# Patient Record
Sex: Male | Born: 1975 | Race: Black or African American | Hispanic: No | Smoking: Current every day smoker
Health system: Southern US, Community
[De-identification: ages and names within clinical notes are randomized; demographics above are authoritative.]

## PROBLEM LIST (undated history)

## (undated) DIAGNOSIS — R569 Unspecified convulsions: Secondary | ICD-10-CM

## (undated) DIAGNOSIS — Z91148 Patient's other noncompliance with medication regimen for other reason: Secondary | ICD-10-CM

## (undated) DIAGNOSIS — Z9114 Patient's other noncompliance with medication regimen: Secondary | ICD-10-CM

## (undated) HISTORY — PX: FIBULA FRACTURE SURGERY: SHX947

---

## 1998-03-15 ENCOUNTER — Emergency Department (HOSPITAL_COMMUNITY): Admission: EM | Admit: 1998-03-15 | Discharge: 1998-03-15 | Payer: Self-pay | Admitting: Emergency Medicine

## 2005-10-05 ENCOUNTER — Encounter: Payer: Self-pay | Admitting: Emergency Medicine

## 2009-06-25 ENCOUNTER — Emergency Department (HOSPITAL_COMMUNITY): Admission: EM | Admit: 2009-06-25 | Discharge: 2009-06-25 | Payer: Self-pay | Admitting: Emergency Medicine

## 2009-08-03 ENCOUNTER — Emergency Department (HOSPITAL_COMMUNITY): Admission: EM | Admit: 2009-08-03 | Discharge: 2009-08-03 | Payer: Self-pay | Admitting: Emergency Medicine

## 2010-01-23 ENCOUNTER — Emergency Department (HOSPITAL_COMMUNITY): Admission: EM | Admit: 2010-01-23 | Discharge: 2010-01-23 | Payer: Self-pay | Admitting: Emergency Medicine

## 2010-04-18 ENCOUNTER — Emergency Department (HOSPITAL_COMMUNITY)
Admission: EM | Admit: 2010-04-18 | Discharge: 2010-04-18 | Payer: Self-pay | Source: Home / Self Care | Admitting: Emergency Medicine

## 2010-06-24 ENCOUNTER — Emergency Department (HOSPITAL_COMMUNITY)
Admission: EM | Admit: 2010-06-24 | Discharge: 2010-06-24 | Payer: Self-pay | Source: Home / Self Care | Admitting: Emergency Medicine

## 2010-06-28 LAB — POCT I-STAT, CHEM 8
BUN: 9 mg/dL (ref 6–23)
Calcium, Ion: 1.02 mmol/L — ABNORMAL LOW (ref 1.12–1.32)
Chloride: 108 mEq/L (ref 96–112)
Glucose, Bld: 87 mg/dL (ref 70–99)
Potassium: 3.8 mEq/L (ref 3.5–5.1)

## 2010-08-17 LAB — BASIC METABOLIC PANEL
BUN: 12 mg/dL (ref 6–23)
Chloride: 112 mEq/L (ref 96–112)
GFR calc non Af Amer: >60 mL/min (ref >60)
Glucose, Bld: 75 mg/dL (ref 70–99)
Potassium: 4.8 mEq/L (ref 3.5–5.1)
Sodium: 143 mEq/L (ref 135–145)

## 2010-08-17 LAB — PHENYTOIN LEVEL, TOTAL: Phenytoin Lvl: <10 ug/mL — ABNORMAL LOW (ref 10.0–20.0)

## 2010-08-20 LAB — URINALYSIS, ROUTINE W REFLEX MICROSCOPIC
Bilirubin Urine: NEGATIVE
Nitrite: NEGATIVE
Specific Gravity, Urine: 1.025 (ref 1.005–1.030)
Urobilinogen, UA: 1 mg/dL (ref 0.0–1.0)
pH: 5.5 (ref 5.0–8.0)

## 2010-08-20 LAB — POCT I-STAT, CHEM 8
BUN: 16 mg/dL (ref 6–23)
Calcium, Ion: 1.12 mmol/L (ref 1.12–1.32)
Chloride: 106 mEq/L (ref 96–112)
HCT: 43 % (ref 39.0–52.0)
Potassium: 3.9 mEq/L (ref 3.5–5.1)

## 2010-08-20 LAB — URINE MICROSCOPIC-ADD ON

## 2010-08-20 LAB — RAPID URINE DRUG SCREEN, HOSP PERFORMED
Amphetamines: NOT DETECTED
Opiates: NOT DETECTED
Tetrahydrocannabinol: POSITIVE — AB

## 2010-08-23 LAB — CBC
HCT: 39.3 % (ref 39.0–52.0)
Hemoglobin: 13.4 g/dL (ref 13.0–17.0)
MCHC: 34.1 g/dL (ref 30.0–36.0)
MCV: 91.9 fL (ref 78.0–100.0)
RBC: 4.28 MIL/uL (ref 4.22–5.81)
WBC: 10.4 10*3/uL (ref 4.0–10.5)

## 2010-08-23 LAB — BASIC METABOLIC PANEL
CO2: 23 mEq/L (ref 19–32)
Chloride: 102 mEq/L (ref 96–112)
GFR calc Af Amer: >60 mL/min (ref >60)
Potassium: 3.9 mEq/L (ref 3.5–5.1)
Sodium: 136 mEq/L (ref 135–145)

## 2010-08-23 LAB — DIFFERENTIAL
Basophils Relative: 1 % (ref 0–1)
Eosinophils Absolute: 0.2 10*3/uL (ref 0.0–0.7)
Lymphs Abs: 1.8 10*3/uL (ref 0.7–4.0)
Monocytes Absolute: 0.7 10*3/uL (ref 0.1–1.0)
Monocytes Relative: 7 % (ref 3–12)
Neutrophils Relative %: 74 % (ref 43–77)

## 2010-08-23 LAB — RAPID URINE DRUG SCREEN, HOSP PERFORMED
Amphetamines: NOT DETECTED
Barbiturates: NOT DETECTED
Benzodiazepines: NOT DETECTED
Tetrahydrocannabinol: NOT DETECTED

## 2010-08-26 ENCOUNTER — Emergency Department (HOSPITAL_COMMUNITY)
Admission: EM | Admit: 2010-08-26 | Discharge: 2010-08-27 | Disposition: A | Discharge status: Home / Self Care | Payer: Self-pay | Attending: Emergency Medicine | Admitting: Emergency Medicine

## 2010-08-26 DIAGNOSIS — R569 Unspecified convulsions: Secondary | ICD-10-CM | POA: Insufficient documentation

## 2010-08-26 DIAGNOSIS — F17200 Nicotine dependence, unspecified, uncomplicated: Secondary | ICD-10-CM | POA: Insufficient documentation

## 2010-08-26 LAB — CBC
MCHC: 34.8 g/dL (ref 30.0–36.0)
MCV: 94.4 fL (ref 78.0–100.0)
Platelets: 232 10*3/uL (ref 150–400)
WBC: 11.5 10*3/uL — ABNORMAL HIGH (ref 4.0–10.5)

## 2010-08-26 LAB — BASIC METABOLIC PANEL
BUN: 7 mg/dL (ref 6–23)
CO2: 26 mEq/L (ref 19–32)
Calcium: 8.3 mg/dL — ABNORMAL LOW (ref 8.4–10.5)
Chloride: 104 mEq/L (ref 96–112)
Creatinine, Ser: 0.79 mg/dL (ref 0.4–1.5)

## 2010-08-26 LAB — DIFFERENTIAL
Basophils Relative: 0 % (ref 0–1)
Eosinophils Absolute: 0.2 10*3/uL (ref 0.0–0.7)
Neutro Abs: 7.9 10*3/uL — ABNORMAL HIGH (ref 1.7–7.7)
Neutrophils Relative %: 69 % (ref 43–77)

## 2010-08-26 LAB — GLUCOSE, CAPILLARY: Glucose-Capillary: 86 mg/dL (ref 70–99)

## 2010-10-13 ENCOUNTER — Emergency Department (HOSPITAL_COMMUNITY)
Admission: EM | Admit: 2010-10-13 | Discharge: 2010-10-13 | Disposition: A | Discharge status: Home / Self Care | Payer: Self-pay | Attending: Emergency Medicine | Admitting: Emergency Medicine

## 2010-10-13 DIAGNOSIS — Z79899 Other long term (current) drug therapy: Secondary | ICD-10-CM | POA: Insufficient documentation

## 2010-10-13 DIAGNOSIS — G40802 Other epilepsy, not intractable, without status epilepticus: Secondary | ICD-10-CM | POA: Insufficient documentation

## 2010-10-13 LAB — BASIC METABOLIC PANEL
CO2: 25 mEq/L (ref 19–32)
Calcium: 9.3 mg/dL (ref 8.4–10.5)
Chloride: 104 mEq/L (ref 96–112)
GFR calc Af Amer: >60 mL/min (ref >60)
Potassium: 3.7 mEq/L (ref 3.5–5.1)
Sodium: 138 mEq/L (ref 135–145)

## 2010-10-13 LAB — CBC
Hemoglobin: 13.2 g/dL (ref 13.0–17.0)
RBC: 4.39 MIL/uL (ref 4.22–5.81)
WBC: 9.3 10*3/uL (ref 4.0–10.5)

## 2010-10-13 LAB — DIFFERENTIAL
Basophils Absolute: 0.1 10*3/uL (ref 0.0–0.1)
Basophils Relative: 1 % (ref 0–1)
Neutro Abs: 6.2 10*3/uL (ref 1.7–7.7)
Neutrophils Relative %: 67 % (ref 43–77)

## 2010-12-08 ENCOUNTER — Emergency Department (HOSPITAL_COMMUNITY)
Admission: EM | Admit: 2010-12-08 | Discharge: 2010-12-09 | Disposition: A | Discharge status: Home / Self Care | Payer: Self-pay | Attending: Emergency Medicine | Admitting: Emergency Medicine

## 2010-12-08 DIAGNOSIS — W503XXA Accidental bite by another person, initial encounter: Secondary | ICD-10-CM | POA: Insufficient documentation

## 2010-12-08 DIAGNOSIS — G40909 Epilepsy, unspecified, not intractable, without status epilepticus: Secondary | ICD-10-CM | POA: Insufficient documentation

## 2010-12-08 DIAGNOSIS — S01501A Unspecified open wound of lip, initial encounter: Secondary | ICD-10-CM | POA: Insufficient documentation

## 2010-12-08 DIAGNOSIS — F17200 Nicotine dependence, unspecified, uncomplicated: Secondary | ICD-10-CM | POA: Insufficient documentation

## 2011-02-07 ENCOUNTER — Emergency Department (HOSPITAL_COMMUNITY)
Admission: EM | Admit: 2011-02-07 | Discharge: 2011-02-08 | Disposition: A | Discharge status: Home / Self Care | Payer: Self-pay | Attending: Emergency Medicine | Admitting: Emergency Medicine

## 2011-02-07 DIAGNOSIS — R569 Unspecified convulsions: Secondary | ICD-10-CM | POA: Insufficient documentation

## 2011-02-07 HISTORY — DX: Unspecified convulsions: R56.9

## 2011-02-07 MED ORDER — LORAZEPAM 1 MG PO TABS
1.0000 mg | ORAL_TABLET | Freq: Once | ORAL | Status: AC
  Administered 2011-02-07: 1 mg via ORAL
  Filled 2011-02-07: qty 1

## 2011-02-07 NOTE — ED Notes (Signed)
Pt states does not remember having the seizure, Sibling at home with him at time and called EMS support. Pt is slow to respond to questions and commands.  No seizure activity noted at this time.

## 2011-02-07 NOTE — ED Provider Notes (Signed)
History     CSN: [id number] Arrival date & time: 02/07/2011  8:52 PM  Chief Complaint  Patient presents with  . Seizures   HPI Comments: Patient is a 35 year old male with a history of seizure disorder takes Dilantin for his seizures. Family admits that he has been out of his medicine for approximately 2 months. According to patient and family member he had a seizure this morning that was not witnessed by a family member but by other people. He came out of it without any difficulty and is felt most of the day dizzy and lightheaded. He denies chest pain, shortness of breath, fever chills nausea or vomiting. He still states that he feels mildly dizzy.  Patient is a 35 y.o. male presenting with seizures. The history is provided by the patient, a relative and medical records. History Limited By: Postictal confusion.  Seizures     Past Medical History  Diagnosis Date  . Seizures     Past Surgical History  Procedure Date  . Unable     History reviewed. No pertinent family history.  History  Substance Use Topics  . Smoking status: Smoker, Current Status Unknown  . Smokeless tobacco: Not on file  . Alcohol Use: Yes      Review of Systems  Unable to perform ROS Neurological: Positive for seizures.    Physical Exam  BP 129/84  Pulse 64  Temp(Src) 98.7 F (37.1 C) (Oral)  Resp 20  SpO2 100%  Physical Exam  Nursing note and vitals reviewed. Constitutional: He appears well-developed and well-nourished. No distress.  HENT:  Head: Normocephalic and atraumatic.  Mouth/Throat: Oropharynx is clear and moist. No oropharyngeal exudate.  Eyes: Conjunctivae and EOM are normal. Pupils are equal, round, and reactive to light. Right eye exhibits no discharge. Left eye exhibits no discharge. No scleral icterus.  Neck: Normal range of motion. Neck supple. No JVD present. No thyromegaly present.  Cardiovascular: Normal rate, regular rhythm, normal heart sounds and intact distal pulses.   Exam reveals no gallop and no friction rub.   No murmur heard. Pulmonary/Chest: Effort normal and breath sounds normal. No respiratory distress. He has no wheezes. He has no rales.  Abdominal: Soft. Bowel sounds are normal. He exhibits no distension and no mass. There is no tenderness.  Musculoskeletal: Normal range of motion. He exhibits no edema and no tenderness.  Lymphadenopathy:    He has no cervical adenopathy.  Neurological: He is alert. Coordination normal.       Patient able to answer most questions though he has to think for some time to get some of the answers. He does answer questions correctly. He is able to follow commands though his motions are slightly slowed. According to the family member this is how he looks after his seizures.  Skin: Skin is warm and dry. No rash noted. No erythema.  Psychiatric: He has a normal mood and affect. His behavior is normal.    ED Course  Procedures  MDM Overall patient is well appearing with normal vital signs. This is consistent with a postictal state and consistent with not taking his medication for the last 2 months. Will check level to be sure, loaded as needed. Ativan by mouth given in emergency department to prevent further seizures    No recurrent seizures while in the emergency department. Ativan given, Dilantin ordered IV. Patient has been given a medication refill for the next 3 months. I have given them strict followup instructions to see the health  Department for ongoing care.  Results for orders placed during the hospital encounter of 02/07/11  PHENYTOIN LEVEL, TOTAL      Component Value Range   Phenytoin Lvl <2.5 (*) 10.0 - 20.0 (ug/mL)  BASIC METABOLIC PANEL      Component Value Range   Sodium 139  135 - 145 (mEq/L)   Potassium 3.6  3.5 - 5.1 (mEq/L)   Chloride 101  96 - 112 (mEq/L)   CO2 27  19 - 32 (mEq/L)   Glucose, Bld 96  70 - 99 (mg/dL)   BUN 11  6 - 23 (mg/dL)   Creatinine, Ser 7.82  0.50 - 1.35 (mg/dL)    Calcium 8.9  8.4 - 10.5 (mg/dL)   GFR calc non Af Amer >60  >60 (mL/min)   GFR calc Af Amer >60  >60 (mL/min)       Vida Roller, MD 02/08/11 [phone]

## 2011-02-07 NOTE — ED Notes (Signed)
No seizure activity noted at this time

## 2011-02-07 NOTE — ED Notes (Signed)
Seizures yesterday, has been out of his seizure meds for a month  And cannot remember the name of the med at this time.   Pt appears post-ictal

## 2011-02-08 LAB — BASIC METABOLIC PANEL
CO2: 27 mEq/L (ref 19–32)
Calcium: 8.9 mg/dL (ref 8.4–10.5)
Chloride: 101 mEq/L (ref 96–112)
Sodium: 139 mEq/L (ref 135–145)

## 2011-02-08 LAB — PHENYTOIN LEVEL, TOTAL: Phenytoin Lvl: <2.5 ug/mL — ABNORMAL LOW (ref 10.0–20.0)

## 2011-02-08 MED ORDER — SODIUM CHLORIDE 0.9 % IV SOLN
1000.0000 mg | Freq: Once | INTRAVENOUS | Status: AC
  Administered 2011-02-08: 1000 mg via INTRAVENOUS
  Filled 2011-02-08: qty 20

## 2011-02-08 MED ORDER — PHENYTOIN SODIUM EXTENDED 100 MG PO CAPS: 100.0000 mg | ORAL_CAPSULE | Freq: Three times a day (TID) | ORAL | Status: DC

## 2011-02-08 MED ORDER — PHENYTOIN SODIUM 50 MG/ML IJ SOLN
INTRAMUSCULAR | Status: AC
  Filled 2011-02-08: qty 20

## 2011-05-13 ENCOUNTER — Other Ambulatory Visit: Payer: Self-pay

## 2011-05-13 ENCOUNTER — Emergency Department (HOSPITAL_COMMUNITY)
Admission: EM | Admit: 2011-05-13 | Discharge: 2011-05-13 | Disposition: A | Discharge status: Home / Self Care | Payer: Self-pay | Attending: Emergency Medicine | Admitting: Emergency Medicine

## 2011-05-13 ENCOUNTER — Encounter (HOSPITAL_COMMUNITY): Payer: Self-pay | Admitting: *Deleted

## 2011-05-13 DIAGNOSIS — R569 Unspecified convulsions: Secondary | ICD-10-CM

## 2011-05-13 DIAGNOSIS — F17200 Nicotine dependence, unspecified, uncomplicated: Secondary | ICD-10-CM | POA: Insufficient documentation

## 2011-05-13 DIAGNOSIS — G40909 Epilepsy, unspecified, not intractable, without status epilepticus: Secondary | ICD-10-CM | POA: Insufficient documentation

## 2011-05-13 DIAGNOSIS — Z9119 Patient's noncompliance with other medical treatment and regimen: Secondary | ICD-10-CM | POA: Insufficient documentation

## 2011-05-13 DIAGNOSIS — Z91199 Patient's noncompliance with other medical treatment and regimen due to unspecified reason: Secondary | ICD-10-CM | POA: Insufficient documentation

## 2011-05-13 LAB — DIFFERENTIAL
Basophils Absolute: 0.1 10*3/uL (ref 0.0–0.1)
Basophils Relative: 1 % (ref 0–1)
Lymphocytes Relative: 28 % (ref 12–46)
Monocytes Relative: 8 % (ref 3–12)
Neutro Abs: 5.3 10*3/uL (ref 1.7–7.7)
Neutrophils Relative %: 61 % (ref 43–77)

## 2011-05-13 LAB — URINALYSIS, ROUTINE W REFLEX MICROSCOPIC
Leukocytes, UA: NEGATIVE
Protein, ur: NEGATIVE mg/dL
Urobilinogen, UA: 0.2 mg/dL (ref 0.0–1.0)

## 2011-05-13 LAB — ETHANOL: Alcohol, Ethyl (B): <11 mg/dL (ref 0–11)

## 2011-05-13 LAB — PHENYTOIN LEVEL, TOTAL: Phenytoin Lvl: <2.5 ug/mL — ABNORMAL LOW (ref 10.0–20.0)

## 2011-05-13 LAB — CBC
Hemoglobin: 13.4 g/dL (ref 13.0–17.0)
MCHC: 34 g/dL (ref 30.0–36.0)
RDW: 14.3 % (ref 11.5–15.5)
WBC: 8.7 10*3/uL (ref 4.0–10.5)

## 2011-05-13 LAB — COMPREHENSIVE METABOLIC PANEL
AST: 20 U/L (ref 0–37)
Albumin: 3.7 g/dL (ref 3.5–5.2)
Alkaline Phosphatase: 119 U/L — ABNORMAL HIGH (ref 39–117)
Chloride: 104 mEq/L (ref 96–112)
Potassium: 4.3 mEq/L (ref 3.5–5.1)
Total Bilirubin: 0.3 mg/dL (ref 0.3–1.2)

## 2011-05-13 LAB — RAPID URINE DRUG SCREEN, HOSP PERFORMED
Barbiturates: NOT DETECTED
Tetrahydrocannabinol: NOT DETECTED

## 2011-05-13 LAB — GLUCOSE, CAPILLARY: Glucose-Capillary: 67 mg/dL — ABNORMAL LOW (ref 70–99)

## 2011-05-13 LAB — URINE MICROSCOPIC-ADD ON

## 2011-05-13 MED ORDER — PHENYTOIN SODIUM EXTENDED 100 MG PO CAPS: 100.0000 mg | ORAL_CAPSULE | Freq: Three times a day (TID) | ORAL | Status: DC

## 2011-05-13 MED ORDER — SODIUM CHLORIDE 0.9 % IV SOLN
1000.0000 mg | Freq: Once | INTRAVENOUS | Status: AC
  Administered 2011-05-13: 1000 mg via INTRAVENOUS
  Filled 2011-05-13: qty 20

## 2011-05-13 MED ORDER — PHENYTOIN SODIUM 50 MG/ML IJ SOLN
INTRAMUSCULAR | Status: AC
  Filled 2011-05-13: qty 20

## 2011-05-13 NOTE — ED Provider Notes (Signed)
Scribed for Dalton Bonier, MD, the patient was seen in room APA05/APA05 . This chart was scribed by Ellie Lunch.   CSN: [id number] Arrival date & time: 05/13/2011  2:26 PM   First MD Initiated Contact with Patient 05/13/11 1508      Chief Complaint  Patient presents with  . Seizures    (Consider location/radiation/quality/duration/timing/severity/associated sxs/prior treatment) Patient is a 35 y.o. male presenting with seizures.  Seizures  This is a chronic problem. The current episode started 6 to 12 hours ago. The problem has been resolved. There was 1 seizure. Duration: unknown seizure was not witnessed. Associated symptoms include sleepiness. The episode was not witnessed. There was no sensation of an aura present. The seizures did not continue in the ED. Possible causes include missed seizure meds. There has been no fever.   Dalton [name] is a 35 y.o. male who presents to the Emergency Department complaining of seizure. Pt w/ history of seizures c/o 1 seizure this morning while Pt was sleeping. Seizure was not witnessed. Pt was notified by his brother who over heard the seizure. Pt c/o of biting his tongue during seizure and felling sleepy afterwards. Pt has a h/o of seizures for past 3-5 years.  Usually takes dilantin, but ran out one week ago. Last known seizure was over one month ago. Pt denies any alcohol or drug usage today.    Past Medical History  Diagnosis Date  . Seizures     Past Surgical History  Procedure Date  . Unable     History reviewed. No pertinent family history.  History  Substance Use Topics  . Smoking status: Current Everyday Smoker -- 0.5 packs/day    Types: Cigarettes  . Smokeless tobacco: Not on file  . Alcohol Use: Yes      Review of Systems  Neurological: Positive for seizures.  10 Systems reviewed and are negative for acute change except as noted in the HPI.   Allergies  Review of patient's allergies indicates no known  allergies.  Home Medications   Current Outpatient Rx  Name Route Sig Dispense Refill  . PHENYTOIN SODIUM EXTENDED 100 MG PO CAPS Oral Take 100 mg by mouth 3 (three) times daily. Patient states he ran out of med       BP 122/85  Pulse 61  Temp(Src) 98.2 F (36.8 C) (Oral)  Resp 20  Ht 5\' 8"  (1.727 m)  Wt 165 lb (74.844 kg)  BMI 25.09 kg/m2  SpO2 100%  Physical Exam  Nursing note and vitals reviewed. Constitutional: He is oriented to person, place, and time. He appears well-developed and well-nourished. No distress.  HENT:  Mouth/Throat: Oropharynx is clear and moist.       Mm moist Some bite marks to side of tongue   Eyes: Pupils are equal, round, and reactive to light.       Normal peripheral vision by confrontation.   Neck: No JVD present. No tracheal deviation present.  Cardiovascular: Normal rate, regular rhythm and normal heart sounds.  Exam reveals no gallop and no friction rub.   No murmur heard. Pulmonary/Chest: Effort normal and breath sounds normal. No respiratory distress.  Abdominal: Soft. Bowel sounds are normal. There is no tenderness.  Neurological: He is alert and oriented to person, place, and time.  Reflex Scores:      Brachioradialis reflexes are 1+ on the right side and 1+ on the left side.      Patellar reflexes are 1+ on the right side  and 1+ on the left side.      Normal symmetric deep tendon reflex at patellar and brachioradials.  Normal coordination by finger to nose     ED Course  Procedures (including critical care time) DIAGNOSTIC STUDIES: Oxygen Saturation is 100% on room air, normal by my interpretation.    COORDINATION OF CARE:    Labs Reviewed  PHENYTOIN LEVEL, TOTAL   No results found.   Date: 05/13/2011  Rate: 61   Rhythm: normal sinus rhythm  QRS Axis: normal  Intervals: normal  ST/T Wave abnormalities: normal  Conduction Disutrbances:none  Narrative Interpretation:   Old EKG Reviewed: unchanged   No diagnosis  found.  3:37 PM Pt admits to noncompliance with dilatin having run out 1 week ago, as such, subtheraputic dilatin is most likely reason for seizure. We will access dilatin level and reload with dilantin as needed. Otherwise ECG shows no sign of arrythmia. Will access CPC for anemia and electrolyte imbalance. UA and blood toxocology for any toxicants. Neruo exam is nonfocal. Does no suggest focal mass legion in brain. With h/o of known seizure disorder,  No CT scan needed at this time.   MDM  The patient has a subtherapeutic Dilantin level do to running out of his medication recently. I will reload his Dilantin in the emergency department by IV prior to discharging him home.  I personally performed the services described in this documentation, which was scribed in my presence. The recorded information has been reviewed and considered.         Dalton Bonier, MD 05/13/11 [phone]

## 2011-05-13 NOTE — ED Notes (Signed)
Pt states he had a seizure this am while sleeping

## 2011-05-13 NOTE — ED Notes (Signed)
Dr. Fredricka Bonine aware of Dilantin level.

## 2011-05-13 NOTE — ED Notes (Signed)
CBG obtained 67. Dr. Fredricka Bonine aware and sandwich with crackers given to pt. Pt a/o x3. resp even/nonlabored. Family at bsd.

## 2011-05-13 NOTE — ED Notes (Signed)
Winnie called for Dilantin

## 2011-07-28 ENCOUNTER — Encounter (HOSPITAL_COMMUNITY): Payer: Self-pay | Admitting: *Deleted

## 2011-07-28 ENCOUNTER — Emergency Department (HOSPITAL_COMMUNITY)
Admission: EM | Admit: 2011-07-28 | Discharge: 2011-07-28 | Disposition: A | Discharge status: Home / Self Care | Payer: Medicare HMO | Attending: Emergency Medicine | Admitting: Emergency Medicine

## 2011-07-28 ENCOUNTER — Emergency Department (HOSPITAL_COMMUNITY): Payer: Medicare HMO

## 2011-07-28 DIAGNOSIS — F17200 Nicotine dependence, unspecified, uncomplicated: Secondary | ICD-10-CM | POA: Insufficient documentation

## 2011-07-28 DIAGNOSIS — R569 Unspecified convulsions: Secondary | ICD-10-CM | POA: Insufficient documentation

## 2011-07-28 DIAGNOSIS — S62309A Unspecified fracture of unspecified metacarpal bone, initial encounter for closed fracture: Secondary | ICD-10-CM | POA: Insufficient documentation

## 2011-07-28 DIAGNOSIS — W1809XA Striking against other object with subsequent fall, initial encounter: Secondary | ICD-10-CM | POA: Insufficient documentation

## 2011-07-28 DIAGNOSIS — S62339A Displaced fracture of neck of unspecified metacarpal bone, initial encounter for closed fracture: Secondary | ICD-10-CM

## 2011-07-28 LAB — CBC
HCT: 37.9 % — ABNORMAL LOW (ref 39.0–52.0)
Hemoglobin: 13.4 g/dL (ref 13.0–17.0)
MCHC: 35.4 g/dL (ref 30.0–36.0)

## 2011-07-28 LAB — DIFFERENTIAL
Basophils Relative: 1 % (ref 0–1)
Monocytes Absolute: 0.7 10*3/uL (ref 0.1–1.0)
Monocytes Relative: 8 % (ref 3–12)
Neutro Abs: 6.6 10*3/uL (ref 1.7–7.7)

## 2011-07-28 LAB — BASIC METABOLIC PANEL
CO2: 24 mEq/L (ref 19–32)
Calcium: 8.6 mg/dL (ref 8.4–10.5)
Creatinine, Ser: 1.11 mg/dL (ref 0.50–1.35)

## 2011-07-28 MED ORDER — PHENYTOIN SODIUM 50 MG/ML IJ SOLN
500.0000 mg | Freq: Once | INTRAMUSCULAR | Status: AC
  Administered 2011-07-28: 500 mg via INTRAVENOUS

## 2011-07-28 MED ORDER — PHENYTOIN SODIUM EXTENDED 100 MG PO CAPS: 100.0000 mg | ORAL_CAPSULE | Freq: Three times a day (TID) | ORAL | Status: DC

## 2011-07-28 MED ORDER — PHENYTOIN SODIUM 50 MG/ML IJ SOLN
INTRAMUSCULAR | Status: AC
  Filled 2011-07-28: qty 10

## 2011-07-28 NOTE — ED Notes (Signed)
Per EMS - pt had witnessed seizure by family approx 30-45 min PTA.  Pt ambulatory on scene per EMS.   Pt has hx of seizures for which he takes Dilantin.  Reports he took his last pill this morning.  Pt alert and oriented x 4 at this time, minor bite marks noted to both sides of tongue.  Pupils Perrla, NSR on monitor.  Seizure precautions with pads initiated.  nad noted.

## 2011-07-28 NOTE — ED Provider Notes (Signed)
History    This chart was scribed for Dalton Lennert, MD, MD by Dalton [name]. The patient was seen in room APA18 and the patient's care was started at 6:45PM.   CSN: [id number]  Arrival date & time 07/28/11  1821   First MD Initiated Contact with Patient 07/28/11 1842      Chief Complaint  Patient presents with  . Seizures    (Consider location/radiation/quality/duration/timing/severity/associated sxs/prior treatment) Patient is a 36 y.o. male presenting with seizures. The history is provided by the patient.  Seizures  This is a recurrent problem. The current episode started 1 to 2 hours ago. The problem has been resolved. There was 1 seizure. The most recent episode lasted more than 5 minutes. Pertinent negatives include no confusion, no headaches and no speech difficulty. Characteristics include rhythmic jerking. The episode was witnessed. There was no sensation of an aura present. The seizures did not continue in the ED. Possible causes include missed seizure meds. There has been no fever.  Dalton [name] is a 36 y.o. male who presents to the Emergency Department BIB EMS complaining of seizure onset today. EMS reports that pt family witnessed pt seizure and that it lasted for 30-45 min PTA. EMS reports pt was ambulatory on scene. Pt is alert and oriented. He has history of seizures and takes Dilantin. Pt reports that he has ran out of medication and the last dose that he took was this morning. During seizure pt hit right hand and has moderate pain in right hand.    Past Medical History  Diagnosis Date  . Seizures     Past Surgical History  Procedure Date  . Unable     No family history on file.  History  Substance Use Topics  . Smoking status: Current Everyday Smoker -- 0.5 packs/day    Types: Cigarettes  . Smokeless tobacco: Not on file  . Alcohol Use: Yes      Review of Systems  Neurological: Positive for seizures. Negative for speech difficulty and headaches.    Psychiatric/Behavioral: Negative for confusion.  All other systems reviewed and are negative.  10 Systems reviewed and are negative for acute change except as noted in the HPI.   Allergies  Review of patient's allergies indicates no known allergies.  Home Medications   Current Outpatient Rx  Name Route Sig Dispense Refill  . PHENYTOIN SODIUM EXTENDED 100 MG PO CAPS Oral Take 1 capsule (100 mg total) by mouth 3 (three) times daily. 90 capsule 1    BP 132/74  Temp(Src) 98.4 F (36.9 C) (Oral)  Resp 16  Ht 5\' 8"  (1.727 m)  Wt 170 lb (77.111 kg)  BMI 25.85 kg/m2  SpO2 98%  Physical Exam  Nursing note and vitals reviewed. Constitutional: He is oriented to person, place, and time. He appears well-developed and well-nourished. No distress.  HENT:  Head: Normocephalic and atraumatic.  Eyes: Conjunctivae and EOM are normal. No scleral icterus.  Neck: Neck supple. No thyromegaly present.  Cardiovascular: Normal rate and regular rhythm.  Exam reveals no gallop and no friction rub.   No murmur heard. Pulmonary/Chest: No stridor. He has no wheezes. He has no rales. He exhibits no tenderness.  Abdominal: He exhibits no distension. There is no tenderness. There is no rebound.  Musculoskeletal: Normal range of motion. He exhibits no edema.       Right hand swelling   Lymphadenopathy:    He has no cervical adenopathy.  Neurological: He is oriented to person,  place, and time. Coordination normal.  Skin: No rash noted. No erythema.  Psychiatric: He has a normal mood and affect. His behavior is normal.    ED Course  Procedures (including critical care time)  DIAGNOSTIC STUDIES: Oxygen Saturation is 98% on room air, normal by my interpretation.    COORDINATION OF CARE: 6:50PM EDP discusses pt ED treatment course with pt    Labs Reviewed  CBC - Abnormal; Notable for the following:    HCT 37.9 (*)    All other components within normal limits  BASIC METABOLIC PANEL - Abnormal;  Notable for the following:    Glucose, Bld 103 (*)    GFR calc non Af Amer 83 (*)    All other components within normal limits  DIFFERENTIAL   Dg Hand Complete Right  07/28/2011  *RADIOLOGY REPORT*  Clinical Data: Injury  RIGHT HAND - COMPLETE 3+ VIEW  Comparison: None.  Findings: There is deformity of the distal aspect of the fifth metacarpal.  There is a cortical step off on the palmar aspect as well as palmar angulation of the epiphysis and metaphysis.  A discrete fracture line cannot be identified.  Soft tissue swelling is also present.  IMPRESSION: There is deformity of the distal fifth metacarpal with a cortical step off.  Acute-on-chronic fracture cannot be excluded.  Correlate with location of pain.  Original Report Authenticated By: Dalton [name], M.D.     No diagnosis found.    MDM  Seizure from running out of meds The chart was scribed for me under my direct supervision.  I personally performed the history, physical, and medical decision making and all procedures in the evaluation of this patient.Dalton Lennert, MD 07/28/11 2133

## 2011-07-28 NOTE — Discharge Instructions (Signed)
Follow up with Dr. Hilda Lias next week for hand

## 2013-06-06 ENCOUNTER — Emergency Department (HOSPITAL_COMMUNITY)
Admission: EM | Admit: 2013-06-06 | Discharge: 2013-06-06 | Disposition: A | Discharge status: Home / Self Care | Payer: Medicare HMO | Attending: Emergency Medicine | Admitting: Emergency Medicine

## 2013-06-06 ENCOUNTER — Encounter (HOSPITAL_COMMUNITY): Payer: Self-pay | Admitting: Emergency Medicine

## 2013-06-06 ENCOUNTER — Emergency Department (HOSPITAL_COMMUNITY): Payer: Medicare HMO

## 2013-06-06 DIAGNOSIS — R5383 Other fatigue: Secondary | ICD-10-CM

## 2013-06-06 DIAGNOSIS — F17200 Nicotine dependence, unspecified, uncomplicated: Secondary | ICD-10-CM | POA: Insufficient documentation

## 2013-06-06 DIAGNOSIS — W1809XA Striking against other object with subsequent fall, initial encounter: Secondary | ICD-10-CM | POA: Insufficient documentation

## 2013-06-06 DIAGNOSIS — R5381 Other malaise: Secondary | ICD-10-CM | POA: Insufficient documentation

## 2013-06-06 DIAGNOSIS — Y929 Unspecified place or not applicable: Secondary | ICD-10-CM | POA: Insufficient documentation

## 2013-06-06 DIAGNOSIS — Y9389 Activity, other specified: Secondary | ICD-10-CM | POA: Insufficient documentation

## 2013-06-06 DIAGNOSIS — R569 Unspecified convulsions: Secondary | ICD-10-CM

## 2013-06-06 DIAGNOSIS — R259 Unspecified abnormal involuntary movements: Secondary | ICD-10-CM | POA: Insufficient documentation

## 2013-06-06 DIAGNOSIS — G40909 Epilepsy, unspecified, not intractable, without status epilepticus: Secondary | ICD-10-CM | POA: Insufficient documentation

## 2013-06-06 DIAGNOSIS — S0510XA Contusion of eyeball and orbital tissues, unspecified eye, initial encounter: Secondary | ICD-10-CM | POA: Insufficient documentation

## 2013-06-06 DIAGNOSIS — Z79899 Other long term (current) drug therapy: Secondary | ICD-10-CM | POA: Insufficient documentation

## 2013-06-06 DIAGNOSIS — F29 Unspecified psychosis not due to a substance or known physiological condition: Secondary | ICD-10-CM | POA: Insufficient documentation

## 2013-06-06 LAB — COMPREHENSIVE METABOLIC PANEL
ALK PHOS: 60 U/L (ref 39–117)
ALT: 13 U/L (ref 0–53)
AST: 18 U/L (ref 0–37)
Albumin: 4.3 g/dL (ref 3.5–5.2)
BILIRUBIN TOTAL: 0.4 mg/dL (ref 0.3–1.2)
BUN: 10 mg/dL (ref 6–23)
CHLORIDE: 104 meq/L (ref 96–112)
CO2: 21 meq/L (ref 19–32)
CREATININE: 0.98 mg/dL (ref 0.50–1.35)
Calcium: 9 mg/dL (ref 8.4–10.5)
GLUCOSE: 78 mg/dL (ref 70–99)
POTASSIUM: 4.4 meq/L (ref 3.7–5.3)
Sodium: 142 mEq/L (ref 137–147)
Total Protein: 8 g/dL (ref 6.0–8.3)

## 2013-06-06 LAB — RAPID URINE DRUG SCREEN, HOSP PERFORMED
AMPHETAMINES: NOT DETECTED
Barbiturates: NOT DETECTED
Benzodiazepines: NOT DETECTED
Cocaine: NOT DETECTED
OPIATES: NOT DETECTED
Tetrahydrocannabinol: NOT DETECTED

## 2013-06-06 LAB — CBC WITH DIFFERENTIAL/PLATELET
Basophils Absolute: 0 10*3/uL (ref 0.0–0.1)
Basophils Relative: 0 % (ref 0–1)
Eosinophils Absolute: 0.1 10*3/uL (ref 0.0–0.7)
Eosinophils Relative: 1 % (ref 0–5)
HEMATOCRIT: 40.2 % (ref 39.0–52.0)
HEMOGLOBIN: 13.6 g/dL (ref 13.0–17.0)
LYMPHS ABS: 1.3 10*3/uL (ref 0.7–4.0)
Lymphocytes Relative: 10 % — ABNORMAL LOW (ref 12–46)
MCH: 31 pg (ref 26.0–34.0)
MCHC: 33.8 g/dL (ref 30.0–36.0)
MCV: 91.6 fL (ref 78.0–100.0)
MONO ABS: 0.7 10*3/uL (ref 0.1–1.0)
MONOS PCT: 6 % (ref 3–12)
NEUTROS ABS: 10.2 10*3/uL — AB (ref 1.7–7.7)
NEUTROS PCT: 83 % — AB (ref 43–77)
Platelets: 217 10*3/uL (ref 150–400)
RBC: 4.39 MIL/uL (ref 4.22–5.81)
RDW: 14 % (ref 11.5–15.5)
WBC: 12.3 10*3/uL — ABNORMAL HIGH (ref 4.0–10.5)

## 2013-06-06 LAB — ETHANOL

## 2013-06-06 LAB — TROPONIN I

## 2013-06-06 MED ORDER — VALPROATE SODIUM 500 MG/5ML IV SOLN
1000.0000 mg | Freq: Once | INTRAVENOUS | Status: AC
  Administered 2013-06-06: 1000 mg via INTRAVENOUS
  Filled 2013-06-06: qty 10

## 2013-06-06 NOTE — Discharge Instructions (Signed)
Get your medicine filled.  Follow up as needed

## 2013-06-06 NOTE — ED Notes (Addendum)
Patient brought in via EMS. Alert and oriented. Airway patent. Patient had x2 witnessed seizures. Patient has hx of seizures. Patient out of seizure medication x1 week. 1st seizure witnessed by girlfriend lasting approx 2 minutes. Patient fell from standing position, hitting head on wall. Per EMS patient put hole in sheet rock from hitting his head. Patient was on LSB with c-collar in place when he had another seizure lasting approx 45 sec according to EMS. Patient removed from c-collar and LSB by EMS during second seizure. Patient given Ativan 2mg  IV by EMS.

## 2013-06-06 NOTE — ED Provider Notes (Signed)
CSN: [id number]     Arrival date & time 06/06/13  1112 History   This chart was scribed for [name], MD by Bennett Scrape, ED Scribe. This patient was seen in room APA08/APA08 and the patient's care was started at 11:30 AM.   Chief Complaint  Patient presents with  . Seizures   Level 5 Caveat-AMS-Postictal State  Patient is a 38 y.o. male presenting with seizures. The history is provided by a relative (pt had a sz today,  he has not been taking his medicine). No language interpreter was used.  Seizures Seizure activity on arrival: yes   Seizure type:  Grand mal Preceding symptoms: no sensation of an aura present   Initial focality:  None Episode characteristics: generalized shaking   Postictal symptoms: confusion   Return to baseline: yes     HPI Comments: Dalton [name] is a 38 y.o. male with a h/o seizures brought in by ambulance from home, who presents to the Emergency Department complaining of multiple seizures witnessed by his girlfriend today. She reports 5 seizures in the past 3 hours, 2 of which were witnessed by EMS. The first seizure occurred from a standing position during which the pt hit his head on a wall. Per EMS patient, the pt put a hole in the sheet rock from hitting his head. This episode lasted approximately 2 minutes. Patient was the put on a LSB with c-collar in place when he had another seizure lasting approximately 45 seconds per EMS. Patient was removed from the c-collar and LSB by EMS during the second seizure. He was given Ativan 2mg  IV en route and is currently postictal. Pt is currently on Depakote per girlfriend and states that he ran out of medication one week ago. She reports that he has seizures on his medications but they are worse off. She states that he has only had 2 in the past month while on his medication.     No PCP  Past Medical History  Diagnosis Date  . Seizures    Past Surgical History  Procedure Laterality Date  . Unable      History reviewed. No pertinent family history. History  Substance Use Topics  . Smoking status: Current Every Day Smoker -- 0.50 packs/day for 13 years    Types: Cigarettes  . Smokeless tobacco: Never Used  . Alcohol Use: Yes    Review of Systems  Unable to perform ROS: Mental status change  Neurological: Positive for seizures.    Allergies  Review of patient's allergies indicates no known allergies.  Home Medications   Current Outpatient Rx  Name  Route  Sig  Dispense  Refill  . EXPIRED: phenytoin (DILANTIN) 100 MG ER capsule   Oral   Take 1 capsule (100 mg total) by mouth 3 (three) times daily.   90 capsule   1   . EXPIRED: phenytoin (DILANTIN) 100 MG ER capsule   Oral   Take 1 capsule (100 mg total) by mouth 3 (three) times daily.   100 capsule   2    Triage Vitals: BP 108/72  Pulse 85  Temp(Src) 98.6 F (37 C) (Oral)  Resp 18  SpO2 97%  Physical Exam  Nursing note and vitals reviewed. Constitutional: He appears well-developed and well-nourished.  HENT:  Head: Normocephalic.  Right periorbital bruising   Eyes: Conjunctivae and EOM are normal. No scleral icterus.  Neck: Neck supple. No thyromegaly present.  Cardiovascular: Normal rate and regular rhythm.  Exam reveals no  gallop and no friction rub.   No murmur heard. Pulmonary/Chest: No stridor. He has no wheezes. He has no rales. He exhibits no tenderness.  Abdominal: He exhibits no distension. There is no tenderness. There is no rebound.  Musculoskeletal: Normal range of motion. He exhibits no edema.  Lymphadenopathy:    He has no cervical adenopathy.  Neurological: He exhibits normal muscle tone. Coordination normal.  Mildly lethargic  Skin: No rash noted. No erythema.  Psychiatric: He has a normal mood and affect. His behavior is normal.    ED Course  Procedures (including critical care time)  Medications  valproate (DEPACON) 1,000 mg in dextrose 5 % 50 mL IVPB (not administered)     DIAGNOSTIC STUDIES: Oxygen Saturation is 97% on RA, adequate by my interpretation.    COORDINATION OF CARE: 11:35 AM-Discussed treatment plan which includes CT of head, CT of c-spine and CBC with pt at bedside and pt agreed to plan.   Labs Review Labs Reviewed - No data to display Imaging Review No results found.  EKG Interpretation   None       MDM  seizure  Benny LennertJoseph L Dawnmarie Breon, MD 06/06/13 1407

## 2013-08-19 ENCOUNTER — Encounter (HOSPITAL_COMMUNITY): Payer: Self-pay | Admitting: Emergency Medicine

## 2013-08-19 ENCOUNTER — Emergency Department (HOSPITAL_COMMUNITY)
Admission: EM | Admit: 2013-08-19 | Discharge: 2013-08-19 | Disposition: A | Discharge status: Home / Self Care | Payer: Self-pay | Attending: Emergency Medicine | Admitting: Emergency Medicine

## 2013-08-19 ENCOUNTER — Emergency Department (HOSPITAL_COMMUNITY): Payer: Self-pay

## 2013-08-19 DIAGNOSIS — Y939 Activity, unspecified: Secondary | ICD-10-CM | POA: Insufficient documentation

## 2013-08-19 DIAGNOSIS — Z79899 Other long term (current) drug therapy: Secondary | ICD-10-CM | POA: Insufficient documentation

## 2013-08-19 DIAGNOSIS — W19XXXA Unspecified fall, initial encounter: Secondary | ICD-10-CM | POA: Insufficient documentation

## 2013-08-19 DIAGNOSIS — Y929 Unspecified place or not applicable: Secondary | ICD-10-CM | POA: Insufficient documentation

## 2013-08-19 DIAGNOSIS — S82832A Other fracture of upper and lower end of left fibula, initial encounter for closed fracture: Secondary | ICD-10-CM

## 2013-08-19 DIAGNOSIS — S82899A Other fracture of unspecified lower leg, initial encounter for closed fracture: Secondary | ICD-10-CM | POA: Insufficient documentation

## 2013-08-19 DIAGNOSIS — F17200 Nicotine dependence, unspecified, uncomplicated: Secondary | ICD-10-CM | POA: Insufficient documentation

## 2013-08-19 DIAGNOSIS — G40909 Epilepsy, unspecified, not intractable, without status epilepticus: Secondary | ICD-10-CM | POA: Insufficient documentation

## 2013-08-19 MED ORDER — OXYCODONE-ACETAMINOPHEN 5-325 MG PO TABS
1.0000 | ORAL_TABLET | Freq: Once | ORAL | Status: AC
  Administered 2013-08-19: 1 via ORAL
  Filled 2013-08-19: qty 1

## 2013-08-19 MED ORDER — OXYCODONE-ACETAMINOPHEN 5-325 MG PO TABS: 1.0000 | ORAL_TABLET | ORAL | Status: DC | PRN

## 2013-08-19 NOTE — ED Notes (Signed)
Pt presents with pain and swelling to left ankle. Pt states he fell and injured ankle Saturday night.

## 2013-08-19 NOTE — ED Provider Notes (Signed)
CSN: [id number]     Arrival date & time 08/19/13  0904 History   This chart was scribed for Dalton Gaskins, MD by Manuela Schwartz, ED scribe. This patient was seen in room APA06/[facility]06 and the patient's care was started at 0904.  Chief Complaint  Patient presents with  . Ankle Injury   The history is provided by the patient. No language interpreter was used.   HPI Comments: Dalton [name] is a 38 y.o. male who presents to the Emergency Department for sudden onset, constant, left ankle pain after he fell and injured it x3 days ago. He states difficult ambulating secondary to pain and pain w/bearing any weight. He denies any other injuries and denies hip or knee pain.   Past Medical History  Diagnosis Date  . Seizures    Past Surgical History  Procedure Laterality Date  . Unable     History reviewed. No pertinent family history. History  Substance Use Topics  . Smoking status: Current Every Day Smoker -- 0.50 packs/day for 13 years    Types: Cigarettes  . Smokeless tobacco: Never Used  . Alcohol Use: Yes    Review of Systems  Constitutional: Negative for fever and chills.  Respiratory: Negative for cough and shortness of breath.   Cardiovascular: Negative for chest pain.  Gastrointestinal: Negative for abdominal pain.  Musculoskeletal: Negative for back pain.       Left ankle pain    Allergies  Review of patient's allergies indicates no known allergies.  Home Medications   Current Outpatient Rx  Name  Route  Sig  Dispense  Refill  . divalproex (DEPAKOTE ER) 500 MG 24 hr tablet   Oral   Take 1,000 mg by mouth daily.          Triage Vitals: BP 140/66  Pulse 75  Temp(Src) 98.6 F (37 C) (Oral)  Resp 18  SpO2 100%  Physical Exam CONSTITUTIONAL: Well developed/well nourished HEAD: Normocephalic/atraumatic EYES: EOMI/PERRL ENMT: Mucous membranes moist NECK: supple no meningeal signs SPINE:entire spine nontender CV: S1/S2 noted, no murmurs/rubs/gallops  noted LUNGS: Lungs are clear to auscultation bilaterally, no apparent distress ABDOMEN: soft, nontender, no rebound or guarding GU:no cva tenderness NEURO: Pt is awake/alert, moves all extremitiesx4 EXTREMITIES: pulses normal, full ROM, tenderness to left lateral malleolus, no proximal fibular tenderness All other extremities/joints palpated/ranged and nontender SKIN: warm, color normal PSYCH: no abnormalities of mood noted  ED Course  Procedures  SPLINT APPLICATION Date/Time: 10:02 AM Authorized by: Dalton [name] Consent: Verbal consent obtained. Risks and benefits: risks, benefits and alternatives were discussed Consent given by: patient Splint applied by: nurse  Location details: left ankle Splint type: left short leg/stirrup Supplies used: ortho glass Post-procedure: The splinted body part was neurovascularly unchanged following the procedure. Patient tolerance: Patient tolerated the procedure well with no immediate complications.    DIAGNOSTIC STUDIES: Oxygen Saturation is 100% on room air, normal by my interpretation.    COORDINATION OF CARE: At 950 AM Discussed treatment plan with patient which includes left ankle X-ray, apply splint, pain medicine. Patient agrees.   Imaging Review Dg Ankle Complete Left  08/19/2013   CLINICAL DATA:  Recent traumatic injury and pain  EXAM: LEFT ANKLE COMPLETE - 3+ VIEW  COMPARISON:  None.  FINDINGS: There is an oblique fracture through the distal fibula with only minimal displacement identified. No tibial fracture is seen. No other focal abnormality is noted.  IMPRESSION: Oblique distal fibular fracture   Electronically Signed   By: Loraine Leriche  Lukens M.D.   On: 08/19/2013 09:34      MDM   Final diagnoses:  Fracture of fibula, distal, left, closed    Nursing notes including past medical history and social history reviewed and considered in documentation xrays reviewed and considered  I personally performed the services described  in this documentation, which was scribed in my presence. The recorded information has been reviewed and is accurate.      Dalton Gaskinsonald W Aissatou Fronczak, MD 08/19/13 1003

## 2013-08-19 NOTE — Discharge Instructions (Signed)
Ankle Fracture °A fracture is a break in the bone. A cast or splint is used to protect and keep your injured bone from moving.  °HOME CARE INSTRUCTIONS  °· Use your crutches as directed. °· To lessen the swelling, keep the injured leg elevated while sitting or lying down. °· Apply ice to the injury for 15-20 minutes, 03-04 times per day while awake for 2 days. Put the ice in a plastic bag and place a thin towel between the bag of ice and your cast. °· If you have a plaster or fiberglass cast: °· Do not try to scratch the skin under the cast using sharp or pointed objects. °· Check the skin around the cast every day. You may put lotion on any red or sore areas. °· Keep your cast dry and clean. °· If you have a plaster splint: °· Wear the splint as directed. °· You may loosen the elastic around the splint if your toes become numb, tingle, or turn cold or blue. °· Do not put pressure on any part of your cast or splint; it may break. Rest your cast only on a pillow the first 24 hours until it is fully hardened. °· Your cast or splint can be protected during bathing with a plastic bag. Do not lower the cast or splint into water. °· Take medications as directed by your caregiver. Only take over-the-counter or prescription medicines for pain, discomfort, or fever as directed by your caregiver. °· Do not drive a vehicle until your caregiver specifically tells you it is safe to do so. °· If your caregiver has given you a follow-up appointment, it is very important to keep that appointment. Not keeping the appointment could result in a chronic or permanent injury, pain, and disability. If there is any problem keeping the appointment, you must call back to this facility for assistance. °SEEK IMMEDIATE MEDICAL CARE IF:  °· Your cast gets damaged or breaks. °· You have continued severe pain or more swelling than you did before the cast was put on. °· Your skin or toenails below the injury turn blue or gray, or feel cold or  numb. °· There is a bad smell or new stains and/or purulent (pus like) drainage coming from under the cast. °If you do not have a window in your cast for observing the wound, a discharge or minor bleeding may show up as a stain on the outside of your cast. Report these findings to your caregiver. °MAKE SURE YOU:  °· Understand these instructions. °· Will watch your condition. °· Will get help right away if you are not doing well or get worse. °Document Released: 05/20/2000 Document Revised: 08/15/2011 Document Reviewed: 12/20/2012 °[employer]® Patient Information ©2014 [employer]. ° °Cast or Splint Care °Casts and splints support injured limbs and keep bones from moving while they heal.  °HOME CARE °· Keep the cast or splint uncovered during the drying period. °· A plaster cast can take 24 to 48 hours to dry. °· A fiberglass cast will dry in less than 1 hour. °· Do not rest the cast on anything harder than a pillow for 24 hours. °· Do not put weight on your injured limb. Do not put pressure on the cast. Wait for your doctor's approval. °· Keep the cast or splint dry. °· Cover the cast or splint with a plastic bag during baths or wet weather. °· If you have a cast over your chest and belly (trunk), take sponge baths until the cast   is taken off. °· If your cast gets wet, dry it with a towel or blow dryer. Use the cool setting on the blow dryer. °· Keep your cast or splint clean. Wash a dirty cast with a damp cloth. °· Do not put any objects under your cast or splint. °· Do not scratch the skin under the cast with an object. If itching is a problem, use a blow dryer on a cool setting over the itchy area. °· Do not trim or cut your cast. °· Do not take out the padding from inside your cast. °· Exercise your joints near the cast as told by your doctor. °· Raise (elevate) your injured limb on 1 or 2 pillows for the first 1 to 3 days. °GET HELP IF: °· Your cast or splint cracks. °· Your cast or splint is too tight or too  loose. °· You itch badly under the cast. °· Your cast gets wet or has a soft spot. °· You have a bad smell coming from the cast. °· You get an object stuck under the cast. °· Your skin around the cast becomes red or sore. °· You have new or more pain after the cast is put on. °GET HELP RIGHT AWAY IF: °· You have fluid leaking through the cast. °· You cannot move your fingers or toes. °· Your fingers or toes turn blue or white or are cool, painful, or puffy (swollen). °· You have tingling or lose feeling (numbness) around the injured area. °· You have bad pain or pressure under the cast. °· You have trouble breathing or have shortness of breath. °· You have chest pain. °Document Released: 09/22/2010 Document Revised: 01/23/2013 Document Reviewed: 11/29/2012 °[employer]® Patient Information ©2014 [employer]. ° °

## 2014-02-02 ENCOUNTER — Encounter (HOSPITAL_COMMUNITY): Payer: Self-pay | Admitting: Emergency Medicine

## 2014-02-02 ENCOUNTER — Emergency Department (HOSPITAL_COMMUNITY)
Admission: EM | Admit: 2014-02-02 | Discharge: 2014-02-02 | Disposition: A | Discharge status: Home / Self Care | Payer: Self-pay | Attending: Emergency Medicine | Admitting: Emergency Medicine

## 2014-02-02 ENCOUNTER — Emergency Department (HOSPITAL_COMMUNITY): Payer: Self-pay

## 2014-02-02 DIAGNOSIS — F17200 Nicotine dependence, unspecified, uncomplicated: Secondary | ICD-10-CM | POA: Insufficient documentation

## 2014-02-02 DIAGNOSIS — R569 Unspecified convulsions: Secondary | ICD-10-CM | POA: Insufficient documentation

## 2014-02-02 DIAGNOSIS — Z9181 History of falling: Secondary | ICD-10-CM | POA: Insufficient documentation

## 2014-02-02 DIAGNOSIS — Z9119 Patient's noncompliance with other medical treatment and regimen: Secondary | ICD-10-CM | POA: Insufficient documentation

## 2014-02-02 DIAGNOSIS — Z91199 Patient's noncompliance with other medical treatment and regimen due to unspecified reason: Secondary | ICD-10-CM | POA: Insufficient documentation

## 2014-02-02 DIAGNOSIS — Z79899 Other long term (current) drug therapy: Secondary | ICD-10-CM

## 2014-02-02 DIAGNOSIS — R892 Abnormal level of other drugs, medicaments and biological substances in specimens from other organs, systems and tissues: Secondary | ICD-10-CM | POA: Insufficient documentation

## 2014-02-02 DIAGNOSIS — G40909 Epilepsy, unspecified, not intractable, without status epilepticus: Secondary | ICD-10-CM | POA: Insufficient documentation

## 2014-02-02 HISTORY — DX: Patient's other noncompliance with medication regimen: Z91.14

## 2014-02-02 HISTORY — DX: Patient's other noncompliance with medication regimen for other reason: Z91.148

## 2014-02-02 LAB — VALPROIC ACID LEVEL: VALPROIC ACID LVL: 14.3 ug/mL — AB (ref 50.0–100.0)

## 2014-02-02 MED ORDER — LORAZEPAM 1 MG PO TABS
ORAL_TABLET | ORAL | Status: AC
  Filled 2014-02-02: qty 1

## 2014-02-02 MED ORDER — VALPROATE SODIUM 500 MG/5ML IV SOLN
1000.0000 mg | Freq: Once | INTRAVENOUS | Status: DC
  Filled 2014-02-02: qty 10

## 2014-02-02 MED ORDER — DIVALPROEX SODIUM ER 500 MG PO TB24: 1000.0000 mg | ORAL_TABLET | Freq: Every day | ORAL | Status: DC

## 2014-02-02 MED ORDER — VALPROATE SODIUM 500 MG/5ML IV SOLN
1000.0000 mg | Freq: Once | INTRAVENOUS | Status: AC
  Administered 2014-02-02: 1000 mg via INTRAVENOUS
  Filled 2014-02-02: qty 10

## 2014-02-02 MED ORDER — VALPROATE SODIUM 500 MG/5ML IV SOLN
INTRAVENOUS | Status: AC
  Filled 2014-02-02: qty 10

## 2014-02-02 MED ORDER — LORAZEPAM 1 MG PO TABS
1.0000 mg | ORAL_TABLET | Freq: Once | ORAL | Status: AC
  Administered 2014-02-02: 1 mg via ORAL

## 2014-02-02 NOTE — ED Notes (Signed)
Placed pt on cardiac monitor
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
## 2014-02-02 NOTE — Discharge Instructions (Signed)
°Emergency Department Resource Guide °1) Find a Doctor and Pay Out of Pocket °Although you won't have to find out who is covered by your insurance plan, it is a good idea to ask around and get recommendations. You will then need to call the office and see if the doctor you have chosen will accept you as a new patient and what types of options they offer for patients who are self-pay. Some doctors offer discounts or will set up payment plans for their patients who do not have insurance, but you will need to ask so you aren't surprised when you get to your appointment. ° °2) Contact Your Local Health Department °Not all health departments have doctors that can see patients for sick visits, but many do, so it is worth a call to see if yours does. If you don't know where your local health department is, you can check in your phone book. The CDC also has a tool to help you locate your state's health department, and many state websites also have listings of all of their local health departments. ° °3) Find a Walk-in Clinic °If your illness is not likely to be very severe or complicated, you may want to try a walk in clinic. These are popping up all over the country in pharmacies, drugstores, and shopping centers. They're usually staffed by nurse practitioners or physician assistants that have been trained to treat common illnesses and complaints. They're usually fairly quick and inexpensive. However, if you have serious medical issues or chronic medical problems, these are probably not your best option. ° °No Primary Care Doctor: °- Call [facility] at  832-8000 - they can help you locate a primary care doctor that  accepts your insurance, provides certain services, etc. °- Physician Referral Service- 1-800-533-3463 ° °Chronic Pain Problems: °Organization         Address  Phone   Notes  °[facility]  (336) 297-2271 Patients need to be referred by their primary care doctor.  ° °Medication  Assistance: °Organization         Address  Phone   Notes  °[place] [facility] [address] °[place], [place] [address] (336) 641-8030 --Must be a resident of [place] °-- Must have NO insurance coverage whatsoever (no Medicaid/ Medicare, etc.) °-- The pt. MUST have a primary care doctor that directs their care regularly and follows them in the community °  °[facility]  (866) 331-1348   °[employer]  (888) 892-1162   ° °Agencies that provide inexpensive medical care: °Organization         Address  Phone   Notes  °[facility]  (336) 832-8035   °[facility]    (336) 832-7272   °[facility] [address] °[place], [place] [address] (336) 832-4777   °[facility] [address], °[place] (336) 271-4999   °[facility]    (336) 373-0678   °[facility]    (336) 272-1050   °[facility] ° [address], [place] Phone:  (336) 832-4444, Fax:  (336) 832-4440 Hours of Operation:  9 am - 6 pm, M-F.  Also accepts Medicaid/Medicare and self-pay.  °[facility] ° [address], [place] Phone: (336) 832-3150, Fax: (336) [phone]. Hours of Operation:  8:30 am - 5:30 pm, M-F.  Also accepts Medicaid and self-pay.  °[facility] [place] [address], [place] Phone: (336) 878-6027   °[facility] [address], [place], [place] (336)723-1848, Ext. 123 Mondays & Thursdays: 7-9 AM.  First 15 patients are seen on a first come, first serve basis. °  ° °Medicaid-accepting [place] Providers: ° °Organization         Address  Phone   Notes  °[facility] [address], [place] (336) 641-2100 Also accepts self-pay patients.  °[facility] [address], [place] ° (336) 856-9996   °[facility] [address], [place]  (336) 288-8857   °[facility] [address] [place] [address], [place] (336) 299-7000   °Veita Bland [address], [place]  ° (336) 373-1557 Only accepts [place] Access Medicaid patients after they have their name applied to their card.  ° °Self-Pay (no insurance) in [place]: ° °Organization         Address  Phone   Notes  °Sickle Cell Patients, [facility] [address], [place] (336) 832-1970   °[facility] [address], [place] (336) 832-4400   °[facility] ° [address], [place] (336) 992-4800   °[facility]/Dr. Osei-Bonsu ° [address] [place] [address], [place] or [address], [place] (336) 841-8500 Phone number for both [place] and [place] locations is the same.  °[facility] [address], [place] (336) 299-0000   °[facility] [address] [place] [address], [place] or [address] (336) 852-7530 °(336) 878-2260   °[facility] [address], [place] (336) 350-1642, phone; (336) 294-5005, fax Sees patients 1st and 3rd Saturday of every month.  Must not qualify for public or private insurance (i.e. Medicaid, Medicare, [place] Health Choice, Veterans' Benefits) • Household income should be no more than 200% of the poverty level •The clinic cannot treat you if you are pregnant or think you are pregnant • Sexually transmitted diseases are not treated at the clinic.  ° ° °Dental Care: °Organization         Address  Phone  Notes  °[place] [facility] [address], [place] (336) 641-6152 Accepts children up to age 21 who are enrolled in Medicaid or [place] Health Choice; pregnant women with a Medicaid card; and children who have applied for Medicaid or [place] Health Choice, but were declined, whose parents can pay a reduced fee at time of service.  °[place]  [facility] [place]  [address], [place] (336) 641-7733 Accepts children up to age 21 who are enrolled in Medicaid or [place] Health Choice; pregnant women with a Medicaid card; and children who have applied for Medicaid or [place] Health Choice, but were declined, whose parents can pay a reduced fee at time of service.  °[facility] ° [address], [place] (336) 641-4533 Patients are seen by appointment only. Walk-ins are not accepted. [facility] will see patients 18 years of age and older. °Monday - Tuesday (8am-5pm) °Most Wednesdays (8:30-5pm) °$30 per visit, cash only  °[facility] ° [address], [place] (336) 641-4533 Patients are seen by appointment only. Walk-ins are not accepted. [facility] will see patients 18 years of age and older. °One   Wednesday Evening (Monthly: Volunteer Based).  $30 per visit, cash only  °[facility]  (919) 537-3737 for adults; Children under age 4, call Graduate Pediatric Dentistry at ([phone]) [phone]. Children aged 4-14, please call (919) [phone] to request a pediatric application. ° Dental services are provided in all areas of dental care including fillings, crowns and bridges, complete and partial dentures, implants, gum treatment, root canals, and extractions. Preventive care is also provided. Treatment is provided to both adults and children. °Patients are selected via a lottery and there is often a waiting list. °  °[facility] [address], °[place] ° (336) 763-8833 [email or web address] °  °[facility] [address], [place], [place] (336)723-1848, Ext. 123 Second and Fourth Thursday of each month, opens at 6:30 AM; Clinic ends at 9 AM.  Patients are seen on a first-come first-served basis, and a limited number are seen during each clinic.  ° °[facility] ° [address], [place], [place] (336) 723-7904    Eligibility Requirements °You must have lived in [place], [place], or [place] counties for at least the last three months. °  You cannot be eligible for state or federal sponsored healthcare insurance, including Veterans Administration, Medicaid, or Medicare. °  You generally cannot be eligible for healthcare insurance through your employer.  °  How to apply: °Eligibility screenings are held every Tuesday and Wednesday afternoon from 1:00 pm until 4:00 pm. You do not need an appointment for the interview!  °[facility] [address], [place], [place] 336-631-2330   °[facility]  336-342-8273   °[facility]  336-703-3100   °[facility]  336-570-6415   ° °Behavioral Health Resources in the Community: °Intensive Outpatient Programs °Organization         Address  Phone  Notes  °[facility] [address], [place], [place] 336-878-6098   °[facility] [address], [place], [place] 336-832-9800   °[facility] [address], [place], [place] ° 336-882-2125   °[facility] [address],  °[place], [place] 1-800-853-5163 or 336-641-4981   °Substance Abuse Resources °Organization         Address  Phone  Notes  °[facility]  336-882-2125   °[facility]  336-784-9470   °[facility]  336-285-9073   °[facility]  336-845-3988   °Residential & Outpatient Substance Abuse Program  1-800-659-3381   °Psychological Services °Organization         Address  Phone  Notes  °[facility]  336- 832-9600   °[facility]  336- 378-7881   °[facility] [address], [place] 1-800-853-5163 or 336-641-4981   ° °Mobile Crisis Teams °Organization         Address  Phone  Notes  °[facility], Mobile Crisis Care Unit  1-877-626-1772   °Assertive °[facility] ° [address]  [place], [place] 336-834-9664   °Sharon DeEsch [address] °[place] 336-554-5454   ° °Self-Help/Support Groups °Organization         Address  Phone             Notes  °[facility]  - variety of support groups  336- 373-1402 Call for more information  °Narcotics Anonymous (NA), [facility] [address], °[place]  2 meetings at this location  ° °  Residential Treatment Programs Organization         Address  Phone  Notes  [facility] [address],    [place]  0-454-098-1191   [facility]  [address] [place] [address], [place], [place] 621-308-6578   [facility] [address] [place], [place] 469-629-5284 Admissions: 8am-3pm M-F  [facility] [address]    [place], [place] 132-440-1027   [facility] [address] [place], [place], [place] 253-664-4034   [facility] [address]  [place], [place] 742-595-6387   [facility] - Intensive Outpatient [address], [place], [place] 564-332-9518   [facility] ([facility] [address]  [place], [place] 8-416-606-3016 or 802-797-7985   [facility] ([facility] [address] [place], [place] 322-025-4270 Accepts Medicaid  [facility] [address] [place] [address],  [place] 6-237-628-3151 Substance Abuse/Addiction Treatment   [facility]         Address  Phone  Notes  [facility]  737-826-5435   Angie Fava, PhD [address] [place] [place], [place]   680 783 4359 or 248-861-8219   [facility]   [address] [place], [place] (779)035-6541   [facility] [address], [place], [place] (906) 581-0529 Insurance/Medicaid/sponsorship through [facility] [address]                                    [place], [place] [phone] Therapy/tele-psych/case    [facility] [address], [place] [phone]    Dr. Lolly Mustache  [phone]   [facility]  [employer] [facility] Dept. 1) [address] [place] 2) [address] [place] 3)  [address], [place] [phone]  ([phone]) [phone]   [facility] Child Abuse Hotline 904-865-8268 or 3347574007 (After Hours)      Take the prescription as directed.  Call the Neurologist tomorrow to schedule a follow up appointment within the next week.  Return to the Emergency Department immediately sooner if worsening.

## 2014-02-02 NOTE — ED Notes (Signed)
ems IV removed, cath intact

## 2014-02-02 NOTE — ED Notes (Signed)
Spoke with [facility] who will bring depakote IV

## 2014-03-12 ENCOUNTER — Telehealth: Payer: Self-pay | Admitting: Family Medicine

## 2014-03-12 NOTE — Telephone Encounter (Signed)
Patient states that he has no insurance and was notified that they would have to pay the day of visit and he is going to wait

## 2014-05-16 ENCOUNTER — Emergency Department (HOSPITAL_COMMUNITY): Payer: Self-pay

## 2014-05-16 ENCOUNTER — Inpatient Hospital Stay (HOSPITAL_COMMUNITY)
Admission: EM | Admit: 2014-05-16 | Discharge: 2014-05-19 | DRG: 988 | Disposition: A | Discharge status: Home / Self Care | Payer: Self-pay | Attending: General Surgery | Admitting: General Surgery

## 2014-05-16 ENCOUNTER — Encounter (HOSPITAL_COMMUNITY): Payer: Self-pay | Admitting: Emergency Medicine

## 2014-05-16 DIAGNOSIS — S0990XA Unspecified injury of head, initial encounter: Secondary | ICD-10-CM

## 2014-05-16 DIAGNOSIS — D62 Acute posthemorrhagic anemia: Secondary | ICD-10-CM | POA: Diagnosis present

## 2014-05-16 DIAGNOSIS — S01511A Laceration without foreign body of lip, initial encounter: Secondary | ICD-10-CM | POA: Diagnosis present

## 2014-05-16 DIAGNOSIS — S66922A Laceration of unspecified muscle, fascia and tendon at wrist and hand level, left hand, initial encounter: Secondary | ICD-10-CM | POA: Diagnosis present

## 2014-05-16 DIAGNOSIS — S02609A Fracture of mandible, unspecified, initial encounter for closed fracture: Secondary | ICD-10-CM | POA: Diagnosis present

## 2014-05-16 DIAGNOSIS — Z23 Encounter for immunization: Secondary | ICD-10-CM

## 2014-05-16 DIAGNOSIS — S0265XA Fracture of angle of mandible, initial encounter for closed fracture: Secondary | ICD-10-CM | POA: Diagnosis present

## 2014-05-16 DIAGNOSIS — F17210 Nicotine dependence, cigarettes, uncomplicated: Secondary | ICD-10-CM | POA: Diagnosis present

## 2014-05-16 DIAGNOSIS — S61412A Laceration without foreign body of left hand, initial encounter: Secondary | ICD-10-CM | POA: Diagnosis present

## 2014-05-16 DIAGNOSIS — W260XXA Contact with knife, initial encounter: Secondary | ICD-10-CM

## 2014-05-16 DIAGNOSIS — Z79899 Other long term (current) drug therapy: Secondary | ICD-10-CM

## 2014-05-16 DIAGNOSIS — S0263XA Fracture of coronoid process of mandible, initial encounter for closed fracture: Principal | ICD-10-CM | POA: Diagnosis present

## 2014-05-16 DIAGNOSIS — S66329A Laceration of extensor muscle, fascia and tendon of unspecified finger at wrist and hand level, initial encounter: Secondary | ICD-10-CM

## 2014-05-16 DIAGNOSIS — Y908 Blood alcohol level of 240 mg/100 ml or more: Secondary | ICD-10-CM | POA: Diagnosis present

## 2014-05-16 DIAGNOSIS — F10129 Alcohol abuse with intoxication, unspecified: Secondary | ICD-10-CM | POA: Diagnosis present

## 2014-05-16 DIAGNOSIS — R569 Unspecified convulsions: Secondary | ICD-10-CM | POA: Diagnosis present

## 2014-05-16 LAB — URINALYSIS, ROUTINE W REFLEX MICROSCOPIC
BILIRUBIN URINE: NEGATIVE
Glucose, UA: NEGATIVE mg/dL
Ketones, ur: NEGATIVE mg/dL
Leukocytes, UA: NEGATIVE
Nitrite: NEGATIVE
PROTEIN: NEGATIVE mg/dL
Specific Gravity, Urine: 1.006 (ref 1.005–1.030)
Urobilinogen, UA: 0.2 mg/dL (ref 0.0–1.0)
pH: 5 (ref 5.0–8.0)

## 2014-05-16 LAB — RAPID URINE DRUG SCREEN, HOSP PERFORMED
Amphetamines: NOT DETECTED
BARBITURATES: NOT DETECTED
Benzodiazepines: NOT DETECTED
Cocaine: NOT DETECTED
Opiates: NOT DETECTED
TETRAHYDROCANNABINOL: NOT DETECTED

## 2014-05-16 LAB — COMPREHENSIVE METABOLIC PANEL
ALK PHOS: 65 U/L (ref 39–117)
ALT: 41 U/L (ref 0–53)
AST: 53 U/L — ABNORMAL HIGH (ref 0–37)
Albumin: 4 g/dL (ref 3.5–5.2)
Anion gap: 24 — ABNORMAL HIGH (ref 5–15)
BILIRUBIN TOTAL: 0.3 mg/dL (ref 0.3–1.2)
BUN: 6 mg/dL (ref 6–23)
CALCIUM: 9.1 mg/dL (ref 8.4–10.5)
CO2: 14 meq/L — AB (ref 19–32)
Chloride: 101 mEq/L (ref 96–112)
Creatinine, Ser: 0.89 mg/dL (ref 0.50–1.35)
GLUCOSE: 86 mg/dL (ref 70–99)
Potassium: 4.4 mEq/L (ref 3.7–5.3)
SODIUM: 139 meq/L (ref 137–147)
Total Protein: 8 g/dL (ref 6.0–8.3)

## 2014-05-16 LAB — CBC WITH DIFFERENTIAL/PLATELET
Basophils Absolute: 0 10*3/uL (ref 0.0–0.1)
Basophils Relative: 0 % (ref 0–1)
Eosinophils Absolute: 0.1 10*3/uL (ref 0.0–0.7)
Eosinophils Relative: 0 % (ref 0–5)
HEMATOCRIT: 41.7 % (ref 39.0–52.0)
Hemoglobin: 14.5 g/dL (ref 13.0–17.0)
LYMPHS ABS: 2.5 10*3/uL (ref 0.7–4.0)
LYMPHS PCT: 13 % (ref 12–46)
MCH: 31.3 pg (ref 26.0–34.0)
MCHC: 34.8 g/dL (ref 30.0–36.0)
MCV: 89.9 fL (ref 78.0–100.0)
MONO ABS: 1.2 10*3/uL — AB (ref 0.1–1.0)
Monocytes Relative: 6 % (ref 3–12)
Neutro Abs: 16.4 10*3/uL — ABNORMAL HIGH (ref 1.7–7.7)
Neutrophils Relative %: 81 % — ABNORMAL HIGH (ref 43–77)
PLATELETS: 193 10*3/uL (ref 150–400)
RBC: 4.64 MIL/uL (ref 4.22–5.81)
RDW: 13.8 % (ref 11.5–15.5)
WBC: 20.3 10*3/uL — AB (ref 4.0–10.5)

## 2014-05-16 LAB — URINE MICROSCOPIC-ADD ON

## 2014-05-16 LAB — ETHANOL: Alcohol, Ethyl (B): 242 mg/dL — ABNORMAL HIGH (ref 0–11)

## 2014-05-16 LAB — TROPONIN I

## 2014-05-16 MED ORDER — CEFAZOLIN SODIUM 1-5 GM-% IV SOLN
1.0000 g | Freq: Once | INTRAVENOUS | Status: AC
  Administered 2014-05-16: 1 g via INTRAVENOUS
  Filled 2014-05-16: qty 50

## 2014-05-16 MED ORDER — IOHEXOL 300 MG/ML  SOLN
100.0000 mL | Freq: Once | INTRAMUSCULAR | Status: AC | PRN
  Administered 2014-05-16: 100 mL via INTRAVENOUS

## 2014-05-16 MED ORDER — TETANUS-DIPHTH-ACELL PERTUSSIS 5-2.5-18.5 LF-MCG/0.5 IM SUSP
0.5000 mL | Freq: Once | INTRAMUSCULAR | Status: AC
  Administered 2014-05-16: 0.5 mL via INTRAMUSCULAR
  Filled 2014-05-16: qty 0.5

## 2014-05-16 MED ORDER — SODIUM CHLORIDE 0.9 % IV BOLUS (SEPSIS)
1000.0000 mL | Freq: Once | INTRAVENOUS | Status: AC
  Administered 2014-05-16: 1000 mL via INTRAVENOUS

## 2014-05-16 NOTE — ED Provider Notes (Signed)
     Dalton BuccoMelanie Florentina Marquart, MD 05/16/14 [phone]
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
## 2014-05-16 NOTE — ED Notes (Signed)
Pt was placed in a gown with continuous cardiac monitoring, BP cuff and pulse ox

## 2014-05-16 NOTE — ED Notes (Signed)
Pt taken to Ct. 
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
## 2014-05-17 ENCOUNTER — Inpatient Hospital Stay (HOSPITAL_COMMUNITY): Payer: Self-pay | Admitting: Anesthesiology

## 2014-05-17 ENCOUNTER — Encounter (HOSPITAL_COMMUNITY): Admission: EM | Disposition: A | Discharge status: Home / Self Care | Payer: Self-pay | Source: Home / Self Care

## 2014-05-17 HISTORY — PX: WOUND EXPLORATION: SHX6188

## 2014-05-17 HISTORY — PX: CLOSED REDUCTION MANDIBLE WITH MANDIBULOMA: SHX5313

## 2014-05-17 LAB — I-STAT CHEM 8, ED
BUN: 4 mg/dL — AB (ref 6–23)
CALCIUM ION: 1.03 mmol/L — AB (ref 1.12–1.23)
CREATININE: 1 mg/dL (ref 0.50–1.35)
Chloride: 105 mEq/L (ref 96–112)
Glucose, Bld: 112 mg/dL — ABNORMAL HIGH (ref 70–99)
HCT: 44 % (ref 39.0–52.0)
Hemoglobin: 15 g/dL (ref 13.0–17.0)
Potassium: 4.1 mEq/L (ref 3.7–5.3)
Sodium: 138 mEq/L (ref 137–147)
TCO2: 18 mmol/L (ref 0–100)

## 2014-05-17 LAB — BASIC METABOLIC PANEL
ANION GAP: 19 — AB (ref 5–15)
BUN: 5 mg/dL — ABNORMAL LOW (ref 6–23)
CALCIUM: 8.1 mg/dL — AB (ref 8.4–10.5)
CO2: 18 meq/L — AB (ref 19–32)
CREATININE: 0.87 mg/dL (ref 0.50–1.35)
Chloride: 104 mEq/L (ref 96–112)
GFR calc Af Amer: >90 mL/min (ref >90)
GLUCOSE: 101 mg/dL — AB (ref 70–99)
Potassium: 4.4 mEq/L (ref 3.7–5.3)
SODIUM: 141 meq/L (ref 137–147)

## 2014-05-17 LAB — CBC
HCT: 36.4 % — ABNORMAL LOW (ref 39.0–52.0)
Hemoglobin: 12.7 g/dL — ABNORMAL LOW (ref 13.0–17.0)
MCH: 31.7 pg (ref 26.0–34.0)
MCHC: 34.9 g/dL (ref 30.0–36.0)
MCV: 90.8 fL (ref 78.0–100.0)
PLATELETS: 195 10*3/uL (ref 150–400)
RBC: 4.01 MIL/uL — AB (ref 4.22–5.81)
RDW: 13.7 % (ref 11.5–15.5)
WBC: 15.3 10*3/uL — ABNORMAL HIGH (ref 4.0–10.5)

## 2014-05-17 SURGERY — CLOSED REDUCTION, MANDIBLE, WITH ARCH BAR APPLICATION AND INTERMAXILLARY FIXATION
Anesthesia: General | Site: Mouth

## 2014-05-17 MED ORDER — LIDOCAINE HCL (PF) 1 % IJ SOLN
5.0000 mL | Freq: Once | INTRAMUSCULAR | Status: AC
  Administered 2014-05-17: 5 mL
  Filled 2014-05-17: qty 5

## 2014-05-17 MED ORDER — SUCCINYLCHOLINE CHLORIDE 20 MG/ML IJ SOLN
INTRAMUSCULAR | Status: DC | PRN
  Administered 2014-05-17: 120 mg via INTRAVENOUS

## 2014-05-17 MED ORDER — FENTANYL CITRATE 0.05 MG/ML IJ SOLN
INTRAMUSCULAR | Status: AC
  Filled 2014-05-17: qty 5

## 2014-05-17 MED ORDER — LIDOCAINE HCL (CARDIAC) 20 MG/ML IV SOLN
INTRAVENOUS | Status: AC
  Filled 2014-05-17: qty 5

## 2014-05-17 MED ORDER — OXYMETAZOLINE HCL 0.05 % NA SOLN
NASAL | Status: AC
  Filled 2014-05-17: qty 15

## 2014-05-17 MED ORDER — PROPOFOL 10 MG/ML IV BOLUS
INTRAVENOUS | Status: AC
  Filled 2014-05-17: qty 20

## 2014-05-17 MED ORDER — ONDANSETRON HCL 4 MG PO TABS: 4.0000 mg | ORAL_TABLET | Freq: Four times a day (QID) | ORAL | Status: DC | PRN

## 2014-05-17 MED ORDER — DEXTROSE-NACL 5-0.9 % IV SOLN
INTRAVENOUS | Status: DC
  Administered 2014-05-17 – 2014-05-19 (×7): via INTRAVENOUS

## 2014-05-17 MED ORDER — FENTANYL CITRATE 0.05 MG/ML IJ SOLN
INTRAMUSCULAR | Status: DC | PRN
  Administered 2014-05-17 (×2): 125 ug via INTRAVENOUS

## 2014-05-17 MED ORDER — LIDOCAINE-EPINEPHRINE 1 %-1:100000 IJ SOLN
INTRAMUSCULAR | Status: DC | PRN
  Administered 2014-05-17: 1 mL

## 2014-05-17 MED ORDER — OXYMETAZOLINE HCL 0.05 % NA SOLN
NASAL | Status: DC | PRN
  Administered 2014-05-17: 1 via NASAL

## 2014-05-17 MED ORDER — BACITRACIN ZINC 500 UNIT/GM EX OINT
TOPICAL_OINTMENT | CUTANEOUS | Status: AC
  Filled 2014-05-17: qty 15

## 2014-05-17 MED ORDER — DEXAMETHASONE SODIUM PHOSPHATE 4 MG/ML IJ SOLN
INTRAMUSCULAR | Status: AC
  Filled 2014-05-17: qty 1

## 2014-05-17 MED ORDER — HYDROMORPHONE HCL 1 MG/ML IJ SOLN: 0.2500 mg | INTRAMUSCULAR | Status: DC | PRN

## 2014-05-17 MED ORDER — LIDOCAINE HCL (CARDIAC) 20 MG/ML IV SOLN
INTRAVENOUS | Status: DC | PRN
  Administered 2014-05-17: 60 mg via INTRAVENOUS

## 2014-05-17 MED ORDER — MIDAZOLAM HCL 2 MG/2ML IJ SOLN
INTRAMUSCULAR | Status: AC
  Filled 2014-05-17: qty 2

## 2014-05-17 MED ORDER — PROPOFOL 10 MG/ML IV BOLUS
INTRAVENOUS | Status: DC | PRN
  Administered 2014-05-17: 200 mg via INTRAVENOUS
  Administered 2014-05-17: 50 mg via INTRAVENOUS

## 2014-05-17 MED ORDER — ONDANSETRON HCL 4 MG/2ML IJ SOLN
INTRAMUSCULAR | Status: AC
  Filled 2014-05-17: qty 2

## 2014-05-17 MED ORDER — SODIUM CHLORIDE 0.9 % IV BOLUS (SEPSIS)
1000.0000 mL | Freq: Once | INTRAVENOUS | Status: AC
  Administered 2014-05-17: 1000 mL via INTRAVENOUS

## 2014-05-17 MED ORDER — GLYCOPYRROLATE 0.2 MG/ML IJ SOLN
INTRAMUSCULAR | Status: AC
  Filled 2014-05-17: qty 1

## 2014-05-17 MED ORDER — SUCCINYLCHOLINE CHLORIDE 20 MG/ML IJ SOLN
INTRAMUSCULAR | Status: AC
  Filled 2014-05-17: qty 1

## 2014-05-17 MED ORDER — LIDOCAINE-EPINEPHRINE 1 %-1:100000 IJ SOLN
INTRAMUSCULAR | Status: AC
  Filled 2014-05-17: qty 1

## 2014-05-17 MED ORDER — HYDROMORPHONE HCL 1 MG/ML IJ SOLN
1.0000 mg | INTRAMUSCULAR | Status: DC | PRN
  Administered 2014-05-17 – 2014-05-19 (×9): 1 mg via INTRAVENOUS
  Filled 2014-05-17 (×9): qty 1

## 2014-05-17 MED ORDER — ONDANSETRON HCL 4 MG/2ML IJ SOLN
INTRAMUSCULAR | Status: DC | PRN
  Administered 2014-05-17: 4 mg via INTRAVENOUS

## 2014-05-17 MED ORDER — CEFAZOLIN SODIUM-DEXTROSE 2-3 GM-% IV SOLR
INTRAVENOUS | Status: AC
  Administered 2014-05-17: 2 g via INTRAVENOUS
  Filled 2014-05-17: qty 50

## 2014-05-17 MED ORDER — ONDANSETRON HCL 4 MG/2ML IJ SOLN: 4.0000 mg | Freq: Four times a day (QID) | INTRAMUSCULAR | Status: DC | PRN

## 2014-05-17 MED ORDER — GLYCOPYRROLATE 0.2 MG/ML IJ SOLN
INTRAMUSCULAR | Status: DC | PRN
  Administered 2014-05-17: 0.2 mg via INTRAVENOUS

## 2014-05-17 MED ORDER — LACTATED RINGERS IV SOLN
INTRAVENOUS | Status: DC | PRN
  Administered 2014-05-17 (×2): via INTRAVENOUS

## 2014-05-17 MED ORDER — DEXAMETHASONE SODIUM PHOSPHATE 4 MG/ML IJ SOLN
INTRAMUSCULAR | Status: DC | PRN
  Administered 2014-05-17: 4 mg via INTRAVENOUS

## 2014-05-17 MED ORDER — CHLORHEXIDINE GLUCONATE 0.12 % MT SOLN
473.0000 mL | Freq: Once | OROMUCOSAL | Status: AC
  Administered 2014-05-17: 473 mL via OROMUCOSAL
  Administered 2014-05-17: 60 mL via OROMUCOSAL
  Filled 2014-05-17: qty 473

## 2014-05-17 MED ORDER — WHITE PETROLATUM GEL
Status: AC
  Administered 2014-05-17: 21:00:00
  Filled 2014-05-17: qty 5

## 2014-05-17 MED ORDER — DIVALPROEX SODIUM ER 500 MG PO TB24
1000.0000 mg | ORAL_TABLET | Freq: Every day | ORAL | Status: DC
  Administered 2014-05-17: 1000 mg via ORAL
  Filled 2014-05-17 (×2): qty 2

## 2014-05-17 MED ORDER — DIVALPROEX SODIUM ER 500 MG PO TB24: 1000.0000 mg | ORAL_TABLET | Freq: Every day | ORAL | Status: DC

## 2014-05-17 SURGICAL SUPPLY — 48 items
25 GAUGE  WIRE0017T4R0 ×2 IMPLANT
BAG DECANTER FOR FLEXI CONT (MISCELLANEOUS) IMPLANT
BANDAGE ELASTIC 3 VELCRO ST LF (GAUZE/BANDAGES/DRESSINGS) ×4 IMPLANT
BLADE 10 SAFETY STRL DISP (BLADE) ×4 IMPLANT
BNDG CMPR 9X4 STRL LF SNTH (GAUZE/BANDAGES/DRESSINGS) ×2
BNDG ESMARK 4X9 LF (GAUZE/BANDAGES/DRESSINGS) ×2 IMPLANT
BNDG GAUZE ELAST 4 BULKY (GAUZE/BANDAGES/DRESSINGS) ×2 IMPLANT
CANISTER SUCTION 2500CC (MISCELLANEOUS) ×4 IMPLANT
CAST PADDING SYN 3 (CAST SUPPLIES) ×2 IMPLANT
CLEANER TIP ELECTROSURG 2X2 (MISCELLANEOUS) ×4 IMPLANT
ELECT COATED BLADE 2.86 ST (ELECTRODE) IMPLANT
ELECT NDL BLADE 2-5/6 (NEEDLE) IMPLANT
ELECT NEEDLE BLADE 2-5/6 (NEEDLE) IMPLANT
ELECT REM PT RETURN 9FT ADLT (ELECTROSURGICAL) ×4
ELECTRODE REM PT RTRN 9FT ADLT (ELECTROSURGICAL) ×2 IMPLANT
GAUZE SPONGE 4X4 12PLY STRL (GAUZE/BANDAGES/DRESSINGS) ×2 IMPLANT
GAUZE XEROFORM 5X9 LF (GAUZE/BANDAGES/DRESSINGS) ×2 IMPLANT
GLOVE ECLIPSE 7.5 STRL STRAW (GLOVE) ×4 IMPLANT
GOWN STRL REUS W/ TWL LRG LVL3 (GOWN DISPOSABLE) ×4 IMPLANT
GOWN STRL REUS W/TWL LRG LVL3 (GOWN DISPOSABLE) ×8
KIT BASIN OR (CUSTOM PROCEDURE TRAY) ×4 IMPLANT
KIT ROOM TURNOVER OR (KITS) ×4 IMPLANT
NS IRRIG 1000ML POUR BTL (IV SOLUTION) ×4 IMPLANT
PAD ARMBOARD 7.5X6 YLW CONV (MISCELLANEOUS) ×8 IMPLANT
PENCIL BUTTON HOLSTER BLD 10FT (ELECTRODE) ×4 IMPLANT
SCISSORS WIRE ANG 4 3/4 DISP (INSTRUMENTS) IMPLANT
SCREW UPPER FACE 2.0X8MM (Screw) ×8 IMPLANT
SELF DRILLING SCREW ×8 IMPLANT
SPLINT FIBERGLASS 3X35 (CAST SUPPLIES) ×2 IMPLANT
SUCTION FRAZIER TIP 8 FR DISP (SUCTIONS) ×2
SUCTION TUBE FRAZIER 8FR DISP (SUCTIONS) IMPLANT
SUT CHROMIC 4 0 PS 2 18 (SUTURE) ×2 IMPLANT
SUT FIBERWIRE 3-0 18 TAPR NDL (SUTURE) ×4
SUT FIBERWIRE 4-0 18 TAPR NDL (SUTURE) ×8
SUT PLAIN 6 0 TG1408 (SUTURE) IMPLANT
SUT PROLENE 5 0 C1 (SUTURE) IMPLANT
SUT VIC AB 2-0 SH 27 (SUTURE) ×4
SUT VIC AB 2-0 SH 27XBRD (SUTURE) IMPLANT
SUT VIC AB 3-0 FS2 27 (SUTURE) IMPLANT
SUT VICRYL RAPIDE 4/0 PS 2 (SUTURE) ×6 IMPLANT
SUTURE FIBERWR 3-0 18 TAPR NDL (SUTURE) IMPLANT
SUTURE FIBERWR 4-0 18 TAPR NDL (SUTURE) IMPLANT
TOWEL OR 17X24 6PK STRL BLUE (TOWEL DISPOSABLE) ×4 IMPLANT
TOWEL OR 17X26 10 PK STRL BLUE (TOWEL DISPOSABLE) ×4 IMPLANT
TRAY ENT MC OR (CUSTOM PROCEDURE TRAY) ×4 IMPLANT
TUBE CONNECTING 12'X1/4 (SUCTIONS) ×1
TUBE CONNECTING 12X1/4 (SUCTIONS) ×1 IMPLANT
WATER STERILE IRR 1000ML POUR (IV SOLUTION) ×4 IMPLANT
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
## 2014-05-17 NOTE — ED Notes (Signed)
Ortho tech called to splint patient's wrist/hand.
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
## 2014-05-17 NOTE — Anesthesia Procedure Notes (Signed)
Procedure Name: Intubation Date/Time: 05/17/2014 8:02 AM Performed by: Alanda AmassFRIEDMAN, Trixy Loyola A Pre-anesthesia Checklist: Patient identified, Timeout performed, Emergency Drugs available, Suction available and Patient being monitored Patient Re-evaluated:Patient Re-evaluated prior to inductionOxygen Delivery Method: Circle system utilized Preoxygenation: Pre-oxygenation with 100% oxygen Intubation Type: IV induction Ventilation: Mask ventilation without difficulty and Nasal airway inserted- appropriate to patient size Nasal Tubes: Nasal Rae and Left Tube size: 7.0 mm Number of attempts: 1 Airway Equipment and Method: Video-laryngoscopy Placement Confirmation: ETT inserted through vocal cords under direct vision,  breath sounds checked- equal and bilateral and positive ETCO2 Secured at: 27 cm Tube secured with: Tape Dental Injury: Teeth and Oropharynx as per pre-operative assessment

## 2014-05-17 NOTE — ED Notes (Signed)
ALL PATIENT BELONGINGS CONVEYED TO FAMILY.

## 2014-05-17 NOTE — Anesthesia Postprocedure Evaluation (Signed)
  Anesthesia Post-op Note  Patient: Dalton [name]  Procedure(s) Performed: Procedure(s): CLOSED REDUCTION MANDIBLE WITH MANDIBULOMAXILLARY FUSION (MMF SCREWS)  REPAIR OF LOWER LIP LACERATION (N/A) LEFT HAND  HAND WOUND EXPLORATION REPAIR AS INDIDATED (Left) LEFT HAND WOUND EXPLORATION REPAIR WITH TENDON AND MUSCLE REPAIR  (Left)  Patient Location: PACU  Anesthesia Type:General  Level of Consciousness: awake  Airway and Oxygen Therapy: Patient Spontanous Breathing  Post-op Pain: mild  Post-op Assessment: Post-op Vital signs reviewed  Post-op Vital Signs: Reviewed  Last Vitals:  Filed Vitals:   05/17/14 1028  BP: 157/79  Pulse: 101  Temp:   Resp: 23    Complications: No apparent anesthesia complications

## 2014-05-17 NOTE — H&P (Signed)
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  Dalton Jacks., Dalton [name] 05/17/2014, 4:41 AM   Procedures

## 2014-05-17 NOTE — Progress Notes (Signed)
The patient underwent mandibulomaxillary fixation and lower lip laceration repair without difficulty. The patient may be discharged home when he is medically stable. Please send the patient home on clindamycin suspension 300 mg by mouth 3 times a day for 10 days, oxycodone suspension 10 mg (10ml) every 6 hours when necessary for pain, and Peridex solution swish and spit 15 mL twice a day for 2 weeks. The patient will need to follow-up in my [place] [facility] in 2 weeks.  He will need to be on full liquid diet for 6 weeks. No other activity restriction. Please send the patient home with his wire cutter, in case of emergency.

## 2014-05-17 NOTE — Brief Op Note (Signed)
05/16/2014 - 05/17/2014  10:05 AM  PATIENT:  Dalton [name]  38 y.o. male  PRE-OPERATIVE DIAGNOSIS:  Left hand laceration  POST-OPERATIVE DIAGNOSIS:  Left hand laceration  PROCEDURE:  Left hand laceration repair  SURGEON:  Surgeon(s) and Role: P  Panel 2:    * Sharma CovertFred W Ailin Rochford, MD - Primary  PHYSICIAN ASSISTANT:   ASSISTANTS: none   ANESTHESIA:   general  EBL:  Total I/O In: 1600 [I.V.:1600] Out: 33 [Blood:33]  BLOOD ADMINISTERED:none  DRAINS: none   LOCAL MEDICATIONS USED:  NONE  SPECIMEN:  No Specimen  DISPOSITION OF SPECIMEN:  N/A  COUNTS:  YES  TOURNIQUET:   Total Tourniquet Time Documented: Upper Arm (Left) - 16 minutes Upper Arm (Left) - 1 minutes Total: Upper Arm (Left) - 17 minutes   DICTATION: [id number]  PLAN OF CARE: Discharge to home after PACU  PATIENT DISPOSITION:  PACU - hemodynamically stable.   Delay start of Pharmacological VTE agent (>24hrs) due to surgical blood loss or risk of bleeding: not applicable

## 2014-05-17 NOTE — Consult Note (Signed)
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  Patient responds to questions appropriately  Skin: Skin is warm and dry. No rash noted. He is not diaphoretic. No erythema.  Psychiatric: He has a normal mood and affect. His behavior is normal. Thought content normal.   Assessment/Plan: Comminuted and displaced right angle of mandible fracture and nondisplaced anterior mandibular fracture. Plan closed reduction of fractures with MMF in OR. Will leave MMF in place for 6 weeks. Pt to f/u at my [facility] in 2 weeks.  Soleil Mas,SUI W 05/17/2014, 7:51 AM
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
## 2014-05-17 NOTE — Transfer of Care (Signed)
Immediate Anesthesia Transfer of Care Note  Patient: Dalton EbbsBilly M [name]  Procedure(s) Performed: Procedure(s): CLOSED REDUCTION MANDIBLE WITH MANDIBULOMAXILLARY FUSION (MMF SCREWS)  REPAIR OF LOWER LIP LACERATION (N/A) LEFT HAND  HAND WOUND EXPLORATION REPAIR AS INDIDATED (Left) LEFT HAND WOUND EXPLORATION REPAIR WITH TENDON AND MUSCLE REPAIR  (Left)  Patient Location: PACU, wire cutter to PACU RN  Anesthesia Type:General  Level of Consciousness: awake  Airway & Oxygen Therapy: Patient Spontanous Breathing  Post-op Assessment: Report given to PACU RN  Post vital signs: Reviewed and stable  Complications: No apparent anesthesia complications

## 2014-05-18 ENCOUNTER — Encounter (HOSPITAL_COMMUNITY): Payer: Self-pay

## 2014-05-18 MED ORDER — VALPROIC ACID 250 MG/5ML PO SYRP
250.0000 mg | ORAL_SOLUTION | Freq: Four times a day (QID) | ORAL | Status: DC
Start: 2014-05-18 — End: 2014-05-19
  Administered 2014-05-18 – 2014-05-19 (×5): 250 mg via ORAL
  Filled 2014-05-18 (×8): qty 5
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
## 2014-05-18 NOTE — Op Note (Signed)
NAME:  Dalton [name], Jene            ACCOUNT NO.:  [id number]  MEDICAL RECORD NO.:  [id number]  LOCATION:  [facility]                         FACILITY:  [facility]  PHYSICIAN:  Madelynn DoneFred W Arvin Abello IV, MD  DATE OF BIRTH:  07/28/1975  DATE OF PROCEDURE:  05/17/2014 DATE OF DISCHARGE:                              OPERATIVE REPORT   POSTOPERATIVE DIAGNOSIS:  Left hand laceration with tendon involvement.  POSTOPERATIVE DIAGNOSIS:  Left hand laceration with tendon involvement.  ATTENDING PHYSICIAN:  Sharma CovertFred W. Boaz Berisha IV, MD, who scrubbed and present for the entire procedure.  ASSISTANT SURGEON:  None.  ANESTHESIA:  General via nasal endotracheal tube.  SURGICAL PROCEDURES: 1. Debridement of skin, subcutaneous tissue, and bone associated with     left hand laceration and excisional debridement. 2. Left hand second dorsal interosseous muscle repair. 3. Left hand EDC to the index tendon repair. 4. Left hand extensor indices proprius tendon repair. 5. Traumatic laceration repair, 10 cm on left hand.  SURGICAL INDICATIONS:  Mr. Gwinda PasseGilliam is a 38 year old right-hand-dominant gentleman who sustained a sharp injury to the dorsal aspect of his left hand.  The patient was seen and evaluated and recommended to undergo the above procedure.  Risks, benefits, and alternatives were discussed in detail with the patient and signed informed consent was obtained.  Risks include, but not limited to bleeding, infection, damage to nearby nerves, arteries, or tendons, loss of motion of wrist and digits, incomplete relief of symptoms, and need for further surgical intervention.  DESCRIPTION OF PROCEDURE:  The patient was properly identified in the preoperative holding area and marked with a permanent marker made on the left hand to indicate the correct operative site.  The patient was then brought back to the operating room, placed supine on the anesthesia room table.  General anesthesia was administered.  The patient  first underwent operative repair of his mandible.  After his mandible was completed, I entered the room.  A well-padded tourniquet was placed on the left upper extremity and then prepped and draped in normal sterile fashion.  Time-out was called, correct side was identified, and procedure then begun.  Attention was then turned to the left hand and the laceration was then extended proximally.  Limb was then elevated and tourniquet insufflated.  Excisional debridement was then carried on to the skin and subcutaneous tissue all the way down to the bone in between the interosseous.  The wound was then thoroughly irrigated.  After debridement of the open laceration and tendon repair within the muscle, the interosseous between the index and long finger dorsal interosseous was then repaired.  Fascia layer was then repaired with 0 Vicryl suture. The wound was then irrigated.  The EDC to the index, EIP to the index were also repaired with a 3-0 FiberWire suture, horizontal mattress, and figure-of-eight sutures and 8 strand repair.  The wound was then thoroughly irrigated.  Following this, traumatic laceration was repaired with 4-0 Vicryl repeat sutures.  The tourniquet was then deflated and hemostasis was obtained.  Xeroform dressing and sterile compressive bandage were then applied.  The patient tolerated the procedure well, was placed in a short-arm volar splint all the way out to the tips of his  fingers, extubated, and taken to recovery room in good condition.  POSTPROCEDURE PLAN:  The patient discharged home, seen back in the office in approximately 2 weeks for wound check, cast for a total of 4 weeks, immobilization of his fingers in full extension, and then we will begin an outpatient therapy regimen.     Madelynn DoneFred W Danie Diehl IV, MD     FWO/MEDQ  D:  05/17/2014  T:  05/17/2014  Job:  [id number]

## 2014-05-19 DIAGNOSIS — D62 Acute posthemorrhagic anemia: Secondary | ICD-10-CM | POA: Diagnosis present

## 2014-05-19 DIAGNOSIS — S61412A Laceration without foreign body of left hand, initial encounter: Secondary | ICD-10-CM | POA: Diagnosis present

## 2014-05-19 DIAGNOSIS — S02609A Fracture of mandible, unspecified, initial encounter for closed fracture: Secondary | ICD-10-CM | POA: Diagnosis present

## 2014-05-19 DIAGNOSIS — S01511A Laceration without foreign body of lip, initial encounter: Secondary | ICD-10-CM | POA: Diagnosis present

## 2014-05-19 DIAGNOSIS — S66922A Laceration of unspecified muscle, fascia and tendon at wrist and hand level, left hand, initial encounter: Secondary | ICD-10-CM | POA: Diagnosis present

## 2014-05-19 MED ORDER — OXYCODONE HCL 5 MG/5ML PO SOLN
10.0000 mg | Freq: Four times a day (QID) | ORAL | Status: DC | PRN
  Administered 2014-05-19: 10 mg via ORAL
  Filled 2014-05-19: qty 10

## 2014-05-19 MED ORDER — CLINDAMYCIN PALMITATE HCL 75 MG/5ML PO SOLR
300.0000 mg | Freq: Three times a day (TID) | ORAL | Status: DC
  Administered 2014-05-19: 300 mg via ORAL
  Filled 2014-05-19 (×4): qty 20

## 2014-05-19 MED ORDER — CLINDAMYCIN PALMITATE HCL 75 MG/5ML PO SOLR: 300.0000 mg | Freq: Three times a day (TID) | ORAL | Status: DC

## 2014-05-19 MED ORDER — OXYCODONE HCL 5 MG/5ML PO SOLN: 10.0000 mg | Freq: Four times a day (QID) | ORAL | Status: DC | PRN

## 2014-05-19 MED ORDER — CHLORHEXIDINE GLUCONATE 0.12 % MT SOLN: 15.0000 mL | Freq: Two times a day (BID) | OROMUCOSAL | Status: DC

## 2014-05-19 MED ORDER — CHLORHEXIDINE GLUCONATE 0.12 % MT SOLN
15.0000 mL | Freq: Two times a day (BID) | OROMUCOSAL | Status: DC
  Administered 2014-05-19: 15 mL via OROMUCOSAL
  Filled 2014-05-19: qty 15

## 2014-05-19 MED ORDER — VALPROIC ACID 250 MG/5ML PO SYRP: 250.0000 mg | ORAL_SOLUTION | Freq: Four times a day (QID) | ORAL | Status: DC

## 2014-05-19 NOTE — Discharge Instructions (Addendum)
Orthopedic Recommendations (Dr. Bari Edwardrtman's) KEEP BANDAGE CLEAN AND DRY CALL OFFICE FOR F/U APPT [phone] in 14 days KEEP HAND ELEVATED ABOVE HEART OK TO APPLY ICE TO OPERATIVE AREA CONTACT OFFICE IF ANY WORSENING PAIN OR CONCERNS.   Facial surgery recommendations (Dr. Suszanne Connerseoh) -Stick to a full liquid diet for 6 weeks - may need to use blender or food processor -Peridex twice a day -Clindamycin 300mg  tab by mouth 3 times a day for 10 days (FINISH ALL ANTIBIOTICS) -Take oxycodone liquid every 6 hours as needed -Use wire cutters in setting of emergency  Full Liquid Diet A full liquid diet may be used:   To help you transition from a clear liquid diet to a soft diet.   When your body is healing and can only tolerate foods that are easy to digest.  Before or after certain a procedure, test, or surgery (such as stomach or intestinal surgeries).   If you have trouble swallowing or chewing.  A full liquid diet includes fluids and foods that are liquid or will become liquid at room temperature. The full liquid diet gives you the proteins, fluids, salts, and minerals that you need for energy. If you continue this diet for more than 72 hours, talk to your health care provider about how many calories you need to consume. If you continue the diet for more than 5 days, talk to your health care provider about taking a multivitamin or a nutritional supplement. WHAT DO I NEED TO KNOW ABOUT A FULL LIQUID DIET?  You may have any liquid.  You may have any food that becomes a liquid at room temperature. The food is considered a liquid if it can be poured off a spoon at room temperature.  Drink one serving of citrus or vitamin C-enriched fruit juice daily. WHAT FOODS CAN I EAT? Grains Any grain food that can be pureed in soup (such as crackers, pasta, and rice). Hot cereal (such as farina or oatmeal) that has been blended. Talk to your health care provider or dietitian about these  foods. Vegetables Pulp-free tomato or vegetable juice. Vegetables pureed in soup.  Fruits Fruit juice, including nectars and juices with pulp. Meats and Other Protein Sources Eggs in custard, eggnog mix, and eggs used in ice cream or pudding. Strained meats, like in baby food, may be allowed. Consult your health care provider.  Dairy Milk and milk-based beverages, including milk shakes and instant breakfast mixes. Smooth yogurt. Pureed cottage cheese. Avoid these foods if they are not well tolerated. Beverages All beverages, including liquid nutritional supplements. Ask your health care provider if you can have carbonated beverages. They may not be well tolerated. Condiments Iodized salt, pepper, spices, and flavorings. Cocoa powder. Vinegar, ketchup, yellow mustard, smooth sauces (such as hollandaise, cheese sauce, or white sauce), and soy sauce. Sweets and Desserts Custard, smooth pudding. Flavored gelatin. Tapioca, junket. Plain ice cream, sherbet, fruit ices. Frozen ice pops, frozen fudge pops, pudding pops, and other frozen bars with cream. Syrups, including chocolate syrup. Sugar, honey, jelly.  Fats and Oils Margarine, butter, cream, sour cream, and oils. Other Broth and cream soups. Strained, broth-based soups. The items listed above may not be a complete list of recommended foods or beverages. Contact your dietitian for more options.  WHAT FOODS CAN I NOT EAT? Grains All breads. Grains are not allowed unless they are pureed into soup. Vegetables Vegetables are not allowed unless they are juiced, or cooked and pureed into soup. Fruits Fruits are not allowed unless they  are juiced. Meats and Other Protein Sources Any meat or fish. Cooked or raw eggs. Nut butters.  Dairy Cheese.  Condiments Stone ground mustards. Fats and Oils Fats that are coarse or chunky. Sweets and Desserts Ice cream or other frozen desserts that have any solids in them or on top, such as nuts, chocolate  chips, and pieces of cookies. Cakes. Cookies. Candy. Others Soups with chunks or pieces in them. The items listed above may not be a complete list of foods and beverages to avoid. Contact your dietitian for more information. Document Released: 05/23/2005 Document Revised: 05/28/2013 Document Reviewed: 03/28/2013 [facility] Patient Information 2015 [employer] [place]. This information is not intended to replace advice given to you by your health care provider. Make sure you discuss any questions you have with your health care provider.
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
## 2014-05-20 ENCOUNTER — Encounter (HOSPITAL_COMMUNITY): Payer: Self-pay | Admitting: Otolaryngology

## 2014-06-05 ENCOUNTER — Ambulatory Visit (INDEPENDENT_AMBULATORY_CARE_PROVIDER_SITE_OTHER): Payer: Self-pay | Admitting: Otolaryngology

## 2014-06-13 ENCOUNTER — Other Ambulatory Visit: Payer: Self-pay | Admitting: Otolaryngology

## 2014-06-30 ENCOUNTER — Ambulatory Visit (HOSPITAL_BASED_OUTPATIENT_CLINIC_OR_DEPARTMENT_OTHER): Payer: MEDICAID | Admitting: Anesthesiology

## 2014-06-30 ENCOUNTER — Ambulatory Visit (HOSPITAL_BASED_OUTPATIENT_CLINIC_OR_DEPARTMENT_OTHER)
Admission: RE | Admit: 2014-06-30 | Discharge: 2014-06-30 | Disposition: A | Discharge status: Home / Self Care | Payer: Self-pay | Source: Ambulatory Visit | Attending: Otolaryngology | Admitting: Otolaryngology

## 2014-06-30 ENCOUNTER — Encounter (HOSPITAL_BASED_OUTPATIENT_CLINIC_OR_DEPARTMENT_OTHER)
Admission: RE | Disposition: A | Discharge status: Home / Self Care | Payer: Self-pay | Source: Ambulatory Visit | Attending: Otolaryngology

## 2014-06-30 ENCOUNTER — Encounter (HOSPITAL_BASED_OUTPATIENT_CLINIC_OR_DEPARTMENT_OTHER): Payer: Self-pay | Admitting: *Deleted

## 2014-06-30 DIAGNOSIS — S0269XD Fracture of mandible of other specified site, subsequent encounter for fracture with routine healing: Secondary | ICD-10-CM | POA: Insufficient documentation

## 2014-06-30 DIAGNOSIS — W500XXD Accidental hit or strike by another person, subsequent encounter: Secondary | ICD-10-CM | POA: Insufficient documentation

## 2014-06-30 DIAGNOSIS — F17210 Nicotine dependence, cigarettes, uncomplicated: Secondary | ICD-10-CM | POA: Insufficient documentation

## 2014-06-30 DIAGNOSIS — Z9114 Patient's other noncompliance with medication regimen: Secondary | ICD-10-CM | POA: Insufficient documentation

## 2014-06-30 HISTORY — PX: MANDIBULAR HARDWARE REMOVAL: SHX5205

## 2014-06-30 LAB — POCT HEMOGLOBIN-HEMACUE: Hemoglobin: 10.9 g/dL — ABNORMAL LOW (ref 13.0–17.0)

## 2014-06-30 SURGERY — REMOVAL, HARDWARE, MANDIBLE
Anesthesia: Monitor Anesthesia Care | Site: Mouth | Laterality: Bilateral

## 2014-06-30 MED ORDER — MEPERIDINE HCL 25 MG/ML IJ SOLN: 6.2500 mg | INTRAMUSCULAR | Status: DC | PRN

## 2014-06-30 MED ORDER — ONDANSETRON HCL 4 MG/2ML IJ SOLN: 4.0000 mg | Freq: Once | INTRAMUSCULAR | Status: DC | PRN

## 2014-06-30 MED ORDER — MIDAZOLAM HCL 5 MG/5ML IJ SOLN
INTRAMUSCULAR | Status: DC | PRN
  Administered 2014-06-30: 2 mg via INTRAVENOUS

## 2014-06-30 MED ORDER — MIDAZOLAM HCL 2 MG/2ML IJ SOLN
INTRAMUSCULAR | Status: AC
  Filled 2014-06-30: qty 2

## 2014-06-30 MED ORDER — HYDROMORPHONE HCL 1 MG/ML IJ SOLN: 0.2500 mg | INTRAMUSCULAR | Status: DC | PRN

## 2014-06-30 MED ORDER — FENTANYL CITRATE 0.05 MG/ML IJ SOLN
INTRAMUSCULAR | Status: AC
  Filled 2014-06-30: qty 4

## 2014-06-30 MED ORDER — LIDOCAINE-EPINEPHRINE 1 %-1:100000 IJ SOLN
INTRAMUSCULAR | Status: AC
  Filled 2014-06-30: qty 1

## 2014-06-30 MED ORDER — LIDOCAINE HCL (CARDIAC) 20 MG/ML IV SOLN
INTRAVENOUS | Status: DC | PRN
  Administered 2014-06-30: 50 mg via INTRAVENOUS

## 2014-06-30 MED ORDER — FENTANYL CITRATE 0.05 MG/ML IJ SOLN
INTRAMUSCULAR | Status: DC | PRN
  Administered 2014-06-30: 100 ug via INTRAVENOUS

## 2014-06-30 MED ORDER — BACITRACIN-NEOMYCIN-POLYMYXIN 400-5-5000 EX OINT
TOPICAL_OINTMENT | CUTANEOUS | Status: AC
  Filled 2014-06-30: qty 1

## 2014-06-30 MED ORDER — PROPOFOL 10 MG/ML IV BOLUS
INTRAVENOUS | Status: DC | PRN
  Administered 2014-06-30: 40 mg via INTRAVENOUS

## 2014-06-30 MED ORDER — LIDOCAINE-EPINEPHRINE 1 %-1:100000 IJ SOLN
INTRAMUSCULAR | Status: DC | PRN
  Administered 2014-06-30: .5 mL

## 2014-06-30 MED ORDER — LACTATED RINGERS IV SOLN
INTRAVENOUS | Status: DC
  Administered 2014-06-30: 11:00:00 via INTRAVENOUS

## 2014-06-30 SURGICAL SUPPLY — 31 items
BLADE SURG 15 STRL LF DISP TIS (BLADE) ×1 IMPLANT
BLADE SURG 15 STRL SS (BLADE) ×3
CANISTER SUCT 1200ML W/VALVE (MISCELLANEOUS) ×3 IMPLANT
COVER MAYO STAND STRL (DRAPES) ×3 IMPLANT
DECANTER SPIKE VIAL GLASS SM (MISCELLANEOUS) ×3 IMPLANT
ELECT COATED BLADE 2.86 ST (ELECTRODE) ×1 IMPLANT
ELECT REM PT RETURN 9FT ADLT (ELECTROSURGICAL)
ELECTRODE REM PT RTRN 9FT ADLT (ELECTROSURGICAL) ×1 IMPLANT
GAUZE SPONGE 4X4 16PLY XRAY LF (GAUZE/BANDAGES/DRESSINGS) IMPLANT
GLOVE BIO SURGEON STRL SZ7.5 (GLOVE) ×3 IMPLANT
GLOVE SURG SS PI 7.0 STRL IVOR (GLOVE) ×2 IMPLANT
GOWN STRL REUS W/ TWL LRG LVL3 (GOWN DISPOSABLE) ×2 IMPLANT
GOWN STRL REUS W/TWL LRG LVL3 (GOWN DISPOSABLE) ×6
MARKER SKIN DUAL TIP RULER LAB (MISCELLANEOUS) IMPLANT
NDL PRECISIONGLIDE 27X1.5 (NEEDLE) ×1 IMPLANT
NEEDLE PRECISIONGLIDE 27X1.5 (NEEDLE) ×3 IMPLANT
NS IRRIG 1000ML POUR BTL (IV SOLUTION) ×1 IMPLANT
PACK BASIN DAY SURGERY FS (CUSTOM PROCEDURE TRAY) ×3 IMPLANT
PENCIL BUTTON HOLSTER BLD 10FT (ELECTRODE) ×1 IMPLANT
SCISSORS WIRE ANG 4 3/4 DISP (INSTRUMENTS) IMPLANT
SHEET MEDIUM DRAPE 40X70 STRL (DRAPES) ×3 IMPLANT
SPONGE GAUZE 4X4 12PLY STER LF (GAUZE/BANDAGES/DRESSINGS) ×4 IMPLANT
SUT CHROMIC 3 0 PS 2 (SUTURE) IMPLANT
SUT CHROMIC 4 0 PS 2 18 (SUTURE) IMPLANT
SUT CHROMIC 4 0 RB 1X27 (SUTURE) IMPLANT
SYR CONTROL 10ML LL (SYRINGE) ×3 IMPLANT
TOWEL OR 17X24 6PK STRL BLUE (TOWEL DISPOSABLE) ×3 IMPLANT
TRAY DSU PREP LF (CUSTOM PROCEDURE TRAY) IMPLANT
TUBE CONNECTING 20'X1/4 (TUBING) ×1
TUBE CONNECTING 20X1/4 (TUBING) ×2 IMPLANT
YANKAUER SUCT BULB TIP NO VENT (SUCTIONS) IMPLANT

## 2014-06-30 NOTE — Anesthesia Preprocedure Evaluation (Addendum)
Anesthesia Evaluation  Patient identified by MRN, date of birth, ID band Patient awake    Reviewed: Allergy & Precautions, NPO status , Patient's Chart, lab work & pertinent test results  Airway    Neck ROM: Full  Mouth opening: Limited Mouth Opening  Dental   Pulmonary Current Smoker,          Cardiovascular     Neuro/Psych Seizures -, Poorly Controlled,     GI/Hepatic   Endo/Other    Renal/GU      Musculoskeletal   Abdominal   Peds  Hematology   Anesthesia Other Findings   Reproductive/Obstetrics                            Anesthesia Physical Anesthesia Plan  ASA: II  Anesthesia Plan: MAC   Post-op Pain Management:    Induction: Intravenous  Airway Management Planned: Simple Face Mask  Additional Equipment:   Intra-op Plan:   Post-operative Plan:   Informed Consent: I have reviewed the patients History and Physical, chart, labs and discussed the procedure including the risks, benefits and alternatives for the proposed anesthesia with the patient or authorized representative who has indicated his/her understanding and acceptance.     Plan Discussed with: CRNA and Surgeon  Anesthesia Plan Comments:         Anesthesia Quick Evaluation

## 2014-06-30 NOTE — Discharge Instructions (Addendum)
The patient may resume all his previous activities. He may advance his diet as tolerated.   Post Anesthesia Home Care Instructions  Activity: Get plenty of rest for the remainder of the day. A responsible adult should stay with you for 24 hours following the procedure.  For the next 24 hours, DO NOT: -Drive a car -Advertising copywriterperate machinery -Drink alcoholic beverages -Take any medication unless instructed by your physician -Make any legal decisions or sign important papers.  Meals: Start with liquid foods such as gelatin or soup. Progress to regular foods as tolerated. Avoid greasy, spicy, heavy foods. If nausea and/or vomiting occur, drink only clear liquids until the nausea and/or vomiting subsides. Call your physician if vomiting continues.  Special Instructions/Symptoms: Your throat may feel dry or sore from the anesthesia or the breathing tube placed in your throat during surgery. If this causes discomfort, gargle with warm salt water. The discomfort should disappear within 24 hours.

## 2014-06-30 NOTE — Transfer of Care (Signed)
Immediate Anesthesia Transfer of Care Note  Patient: Dalton [name]  Procedure(s) Performed: Procedure(s): MANDIBULAR/MAXILLARY HARDWARE REMOVAL (Bilateral)  Patient Location: PACU  Anesthesia Type:MAC  Level of Consciousness: awake, alert  and oriented  Airway & Oxygen Therapy: Patient Spontanous Breathing and Patient connected to nasal cannula oxygen  Post-op Assessment: Report given to PACU RN and Post -op Vital signs reviewed and stable  Post vital signs: Reviewed and stable  Complications: No apparent anesthesia complications

## 2014-06-30 NOTE — Anesthesia Procedure Notes (Signed)
Procedure Name: MAC Performed by: Jen Eppinger W Pre-anesthesia Checklist: Patient identified, Timeout performed, Emergency Drugs available, Suction available and Patient being monitored Patient Re-evaluated:Patient Re-evaluated prior to inductionOxygen Delivery Method: Simple face mask Placement Confirmation: positive ETCO2 Dental Injury: Teeth and Oropharynx as per pre-operative assessment      

## 2014-06-30 NOTE — Anesthesia Postprocedure Evaluation (Signed)
Anesthesia Post Note  Patient: Dalton [name]  Procedure(s) Performed: Procedure(s) (LRB): MANDIBULAR/MAXILLARY HARDWARE REMOVAL (Bilateral)  Anesthesia type: MAC  Patient location: PACU  Post pain: Pain level controlled  Post assessment: Patient's Cardiovascular Status Stable  Last Vitals:  Filed Vitals:   06/30/14 1246  BP: 117/78  Pulse: 62  Temp: 36.5 C  Resp: 18    Post vital signs: Reviewed and stable  Level of consciousness: sedated  Complications: No apparent anesthesia complications

## 2014-06-30 NOTE — Brief Op Note (Signed)
06/30/2014  11:33 AM  PATIENT:  Dalton [name]  39 y.o. male  PRE-OPERATIVE DIAGNOSIS:  MANDIBLE FRACTURES  POST-OPERATIVE DIAGNOSIS:  MANDIBLE FRACTURES  PROCEDURE:  Procedure(s): MANDIBULAR/MAXILLARY FIXATION HARDWARE REMOVAL (Bilateral)  SURGEON:  Surgeon(s) and Role:    * Darletta MollSui W Sena Clouatre, MD - Primary  PHYSICIAN ASSISTANT:   ASSISTANTS: none   ANESTHESIA:   IV sedation  EBL:     BLOOD ADMINISTERED:none  DRAINS: none   LOCAL MEDICATIONS USED:  LIDOCAINE   SPECIMEN:  No Specimen  DISPOSITION OF SPECIMEN:  N/A  COUNTS:  YES  TOURNIQUET:  * No tourniquets in log *  DICTATION: .Other Dictation: Dictation Number [id number]  PLAN OF CARE: Discharge to home after PACU  PATIENT DISPOSITION:  PACU - hemodynamically stable.   Delay start of Pharmacological VTE agent (>24hrs) due to surgical blood loss or risk of bleeding: not applicable
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
## 2014-07-01 ENCOUNTER — Encounter (HOSPITAL_BASED_OUTPATIENT_CLINIC_OR_DEPARTMENT_OTHER): Payer: Self-pay | Admitting: Otolaryngology
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
## 2014-07-29 ENCOUNTER — Emergency Department (HOSPITAL_COMMUNITY)
Admission: EM | Admit: 2014-07-29 | Discharge: 2014-07-29 | Disposition: A | Discharge status: Home / Self Care | Payer: Self-pay | Attending: Emergency Medicine | Admitting: Emergency Medicine

## 2014-07-29 ENCOUNTER — Encounter (HOSPITAL_COMMUNITY): Payer: Self-pay | Admitting: *Deleted

## 2014-07-29 DIAGNOSIS — Z792 Long term (current) use of antibiotics: Secondary | ICD-10-CM | POA: Insufficient documentation

## 2014-07-29 DIAGNOSIS — G40909 Epilepsy, unspecified, not intractable, without status epilepticus: Secondary | ICD-10-CM | POA: Insufficient documentation

## 2014-07-29 DIAGNOSIS — R569 Unspecified convulsions: Secondary | ICD-10-CM

## 2014-07-29 DIAGNOSIS — Z720 Tobacco use: Secondary | ICD-10-CM | POA: Insufficient documentation

## 2014-07-29 DIAGNOSIS — Z79899 Other long term (current) drug therapy: Secondary | ICD-10-CM | POA: Insufficient documentation

## 2014-07-29 DIAGNOSIS — Z760 Encounter for issue of repeat prescription: Secondary | ICD-10-CM | POA: Insufficient documentation

## 2014-07-29 LAB — VALPROIC ACID LEVEL: VALPROIC ACID LVL: 93 ug/mL (ref 50.0–100.0)

## 2014-07-29 MED ORDER — DIVALPROEX SODIUM 250 MG PO DR TAB
250.0000 mg | DELAYED_RELEASE_TABLET | Freq: Once | ORAL | Status: AC
  Administered 2014-07-29: 250 mg via ORAL
  Filled 2014-07-29: qty 1

## 2014-07-29 MED ORDER — DIVALPROEX SODIUM 500 MG PO DR TAB: 1000.0000 mg | DELAYED_RELEASE_TABLET | Freq: Two times a day (BID) | ORAL | Status: DC
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
## 2014-07-29 NOTE — Discharge Instructions (Signed)
°Emergency Department Resource Guide °1) Find a Doctor and Pay Out of Pocket °Although you won't have to find out who is covered by your insurance plan, it is a good idea to ask around and get recommendations. You will then need to call the office and see if the doctor you have chosen will accept you as a new patient and what types of options they offer for patients who are self-pay. Some doctors offer discounts or will set up payment plans for their patients who do not have insurance, but you will need to ask so you aren't surprised when you get to your appointment. ° °2) Contact Your Local Health Department °Not all health departments have doctors that can see patients for sick visits, but many do, so it is worth a call to see if yours does. If you don't know where your local health department is, you can check in your phone book. The CDC also has a tool to help you locate your state's health department, and many state websites also have listings of all of their local health departments. ° °3) Find a Walk-in Clinic °If your illness is not likely to be very severe or complicated, you may want to try a walk in clinic. These are popping up all over the country in pharmacies, drugstores, and shopping centers. They're usually staffed by nurse practitioners or physician assistants that have been trained to treat common illnesses and complaints. They're usually fairly quick and inexpensive. However, if you have serious medical issues or chronic medical problems, these are probably not your best option. ° °No Primary Care Doctor: °- Call [facility] at  832-8000 - they can help you locate a primary care doctor that  accepts your insurance, provides certain services, etc. °- Physician Referral Service- 1-800-533-3463 ° °Chronic Pain Problems: °Organization         Address  Phone   Notes  °[facility]  (336) 297-2271 Patients need to be referred by their primary care doctor.  ° °Medication  Assistance: °Organization         Address  Phone   Notes  °[place] [facility] [address] °[place], [place] [address] (336) 641-8030 --Must be a resident of [place] °-- Must have NO insurance coverage whatsoever (no Medicaid/ Medicare, etc.) °-- The pt. MUST have a primary care doctor that directs their care regularly and follows them in the community °  °[facility]  (866) 331-1348   °[employer]  (888) 892-1162   ° °Agencies that provide inexpensive medical care: °Organization         Address  Phone   Notes  °[facility]  (336) 832-8035   °[facility]    (336) 832-7272   °[facility] [address] °[place], [place] [address] (336) 832-4777   °[facility] [address], °[place] (336) 271-4999   °[facility]    (336) 373-0678   °[facility]    (336) 272-1050   °[facility] ° [address], [place] Phone:  (336) 832-4444, Fax:  (336) 832-4440 Hours of Operation:  9 am - 6 pm, M-F.  Also accepts Medicaid/Medicare and self-pay.  °[facility] ° [address], [place] Phone: (336) 832-3150, Fax: (336) [phone]. Hours of Operation:  8:30 am - 5:30 pm, M-F.  Also accepts Medicaid and self-pay.  °[facility] [place] [address], [place] Phone: (336) 878-6027   °[facility] [address], [place], [place] (336)723-1848, Ext. 123 Mondays & Thursdays: 7-9 AM.  First 15 patients are seen on a first come, first serve basis. °  ° °Medicaid-accepting [place] Providers: ° °Organization         Address  Phone   Notes  °[facility] [address], [place] (336) 641-2100 Also accepts self-pay patients.  °[facility] [address], [place] ° (336) 856-9996   °[facility] [address], [place]  (336) 288-8857   °[facility] [address] [place] [address], [place] (336) 299-7000   °Veita Bland [address], [place]  ° (336) 373-1557 Only accepts [place] Access Medicaid patients after they have their name applied to their card.  ° °Self-Pay (no insurance) in [place]: ° °Organization         Address  Phone   Notes  °Sickle Cell Patients, [facility] [address], [place] (336) 832-1970   °[facility] [address], [place] (336) 832-4400   °[facility] ° [address], [place] (336) 992-4800   °[facility]/Dr. Osei-Bonsu ° [address] [place] [address], [place] or [address], [place] (336) 841-8500 Phone number for both [place] and [place] locations is the same.  °[facility] [address], [place] (336) 299-0000   °[facility] [address] [place] [address], [place] or [address] (336) 852-7530 °(336) 878-2260   °[facility] [address], [place] (336) 350-1642, phone; (336) 294-5005, fax Sees patients 1st and 3rd Saturday of every month.  Must not qualify for public or private insurance (i.e. Medicaid, Medicare, [place] Health Choice, Veterans' Benefits) • Household income should be no more than 200% of the poverty level •The clinic cannot treat you if you are pregnant or think you are pregnant • Sexually transmitted diseases are not treated at the clinic.  ° ° °Dental Care: °Organization         Address  Phone  Notes  °[place] [facility] [address], [place] (336) 641-6152 Accepts children up to age 21 who are enrolled in Medicaid or [place] Health Choice; pregnant women with a Medicaid card; and children who have applied for Medicaid or [place] Health Choice, but were declined, whose parents can pay a reduced fee at time of service.  °[place]  [facility] [place]  [address], [place] (336) 641-7733 Accepts children up to age 21 who are enrolled in Medicaid or [place] Health Choice; pregnant women with a Medicaid card; and children who have applied for Medicaid or [place] Health Choice, but were declined, whose parents can pay a reduced fee at time of service.  °[facility] ° [address], [place] (336) 641-4533 Patients are seen by appointment only. Walk-ins are not accepted. [facility] will see patients 18 years of age and older. °Monday - Tuesday (8am-5pm) °Most Wednesdays (8:30-5pm) °$30 per visit, cash only  °[facility] ° [address], [place] (336) 641-4533 Patients are seen by appointment only. Walk-ins are not accepted. [facility] will see patients 18 years of age and older. °One   Wednesday Evening (Monthly: Volunteer Based).  $30 per visit, cash only  °[facility]  (919) 537-3737 for adults; Children under age 4, call Graduate Pediatric Dentistry at ([phone]) [phone]. Children aged 4-14, please call (919) [phone] to request a pediatric application. ° Dental services are provided in all areas of dental care including fillings, crowns and bridges, complete and partial dentures, implants, gum treatment, root canals, and extractions. Preventive care is also provided. Treatment is provided to both adults and children. °Patients are selected via a lottery and there is often a waiting list. °  °[facility] [address], °[place] ° (336) 763-8833 [email or web address] °  °[facility] [address], [place], [place] (336)723-1848, Ext. 123 Second and Fourth Thursday of each month, opens at 6:30 AM; Clinic ends at 9 AM.  Patients are seen on a first-come first-served basis, and a limited number are seen during each clinic.  ° °[facility] ° [address], [place], [place] (336) 723-7904    Eligibility Requirements °You must have lived in [place], [place], or [place] counties for at least the last three months. °  You cannot be eligible for state or federal sponsored healthcare insurance, including Veterans Administration, Medicaid, or Medicare. °  You generally cannot be eligible for healthcare insurance through your employer.  °  How to apply: °Eligibility screenings are held every Tuesday and Wednesday afternoon from 1:00 pm until 4:00 pm. You do not need an appointment for the interview!  °[facility] [address], [place], [place] 336-631-2330   °[facility]  336-342-8273   °[facility]  336-703-3100   °[facility]  336-570-6415   ° °Behavioral Health Resources in the Community: °Intensive Outpatient Programs °Organization         Address  Phone  Notes  °[facility] [address], [place], [place] 336-878-6098   °[facility] [address], [place], [place] 336-832-9800   °[facility] [address], [place], [place] ° 336-882-2125   °[facility] [address],  °[place], [place] 1-800-853-5163 or 336-641-4981   °Substance Abuse Resources °Organization         Address  Phone  Notes  °[facility]  336-882-2125   °[facility]  336-784-9470   °[facility]  336-285-9073   °[facility]  336-845-3988   °Residential & Outpatient Substance Abuse Program  1-800-659-3381   °Psychological Services °Organization         Address  Phone  Notes  °[facility]  336- 832-9600   °[facility]  336- 378-7881   °[facility] [address], [place] 1-800-853-5163 or 336-641-4981   ° °Mobile Crisis Teams °Organization         Address  Phone  Notes  °[facility], Mobile Crisis Care Unit  1-877-626-1772   °Assertive °[facility] ° [address]  [place], [place] 336-834-9664   °Sharon DeEsch [address] °[place] 336-554-5454   ° °Self-Help/Support Groups °Organization         Address  Phone             Notes  °[facility] of  - variety of support groups  336- 373-1402 Call for more information  °Narcotics Anonymous (NA), [facility] [address], °[place]  2 meetings at this location  ° °  Residential Treatment Programs Organization         Address  Phone  Notes  [facility] [address],    [place]  [phone]   [facility]  [address], [place] [phone], [place], [place] [phone]) [phone]   [facility] [address], [place] [phone] Admissions: 8am-3pm M-F  [facility] [address]    [place], [place] [phone]   [facility] [address], [place], [place] [phone]   [facility] [address]  [place], [place] [phone]   [facility] Intensive Outpatient [address], [place], [place] [phone]   [facility] ([facility] [address]  [place], [place] [phone] or [phone]   [facility] ([facility]) [address] [place], [place] [phone] Accepts Medicaid  [facility] [address]  [place] [phone] Substance Abuse/Addiction Treatment   [facility]         Address  Phone  Notes  [facility]  (939)[phone](888) [phone]   Angie FavaJulie Brannon, PhD [address], [address] [place], [place]   [phone](336) [phone] or (818) [phone](336) [phone]   [facility]   [address] [place], [place] [phone](336) [phone]   [facility] [address], [place], [place] [phone](336) [phone] Insurance/Medicaid/sponsorship through [facility]  [facility] [address]                                    [place], [place] [phone](336) [phone] Therapy/tele-psych/case    [facility] [address]   [place], [place] [phone](336) [phone]    Dr. Lolly MustacheArfeen  [phone](336) [phone]   [facility] [place]  [employer] [facility] 1) [address], [place] 2) [address], [place] 3)  [address], [place] (301) [phone](336) [phone](336) [phone]  [phone](336) [phone]   [facility] 661-305-2409(336) 807-787-1462 or 2173001914(336) [phone] (After Hours)      Take the prescription as directed.  Call your regular medical doctor and the Neurologist today to schedule a follow up appointment within the next week.  Return to the Emergency Department immediately sooner if worsening.

## 2014-07-29 NOTE — ED Provider Notes (Signed)
CSN: [id number]     Arrival date & time 07/29/14  1122 History   First MD Initiated Contact with Patient 07/29/14 1157     Chief Complaint  Patient presents with  . Seizures     HPI Per EMS and pt report, c/o sudden onset and resolution of one episode of "seizure" that occurred PTA. Pt states he took his last dose of depakote "before I left jail" yesterday, and has not had his usual dose today. Pt states he needs a refill of his depakote rx. Pt feels back to his baseline on arrival to the ED. Pt denies any other complaints. Denies CP/SOB, no abd pain, no N/V/D, no fevers, no focal motor weakness, no tingling/numbness in extremities.    Past Medical History  Diagnosis Date  . Seizures   . Noncompliance with medications    Past Surgical History  Procedure Laterality Date  . Fibula fracture surgery    . Closed reduction mandible with mandibuloma N/A 05/17/2014    Procedure: CLOSED REDUCTION MANDIBLE WITH MANDIBULOMAXILLARY FUSION (MMF SCREWS)  REPAIR OF LOWER LIP LACERATION;  Surgeon: Darletta MollSui W Teoh, MD;  [facility];  Service: ENT;  Laterality: N/A;  . Wound exploration Left 05/17/2014    Procedure: LEFT HAND  HAND WOUND EXPLORATION REPAIR AS INDIDATED;  Surgeon: Darletta MollSui W Teoh, MD;  [facility];  Service: ENT;  Laterality: Left;  . Wound exploration Left 05/17/2014    Procedure: LEFT HAND WOUND EXPLORATION REPAIR WITH TENDON AND MUSCLE REPAIR ;  Surgeon: Sharma CovertFred W Ortmann, MD;  [facility];  Service: Orthopedics;  Laterality: Left;  Marland Kitchen. Mandibular hardware removal Bilateral 06/30/2014    Procedure: MANDIBULAR/MAXILLARY HARDWARE REMOVAL;  Surgeon: Darletta MollSui W Teoh, MD;  Location:  SURGERY CENTER;  Service: ENT;  Laterality: Bilateral;   Family History  Problem Relation Age of Onset  . Diabetes Mellitus II Mother   . Diabetes Mellitus II Father    History  Substance Use Topics  . Smoking status: Current Every Day Smoker -- 0.50 packs/day for 13 years    Types: Cigarettes  .  Smokeless tobacco: Never Used  . Alcohol Use: 2.4 oz/week    4 Cans of beer per week    Review of Systems ROS: Statement: All systems negative except as marked or noted in the HPI; Constitutional: Negative for fever and chills. ; ; Eyes: Negative for eye pain, redness and discharge. ; ; ENMT: Negative for ear pain, hoarseness, nasal congestion, sinus pressure and sore throat. ; ; Cardiovascular: Negative for chest pain, palpitations, diaphoresis, dyspnea and peripheral edema. ; ; Respiratory: Negative for cough, wheezing and stridor. ; ; Gastrointestinal: Negative for nausea, vomiting, diarrhea, abdominal pain, blood in stool, hematemesis, jaundice and rectal bleeding. . ; ; Genitourinary: Negative for dysuria, flank pain and hematuria. ; ; Musculoskeletal: Negative for back pain and neck pain. Negative for swelling and trauma.; ; Skin: Negative for pruritus, rash, abrasions, blisters, bruising and skin lesion.; ; Neuro: Negative for headache, lightheadedness and neck stiffness. Negative for weakness, extremity weakness, paresthesias, +seizure.      Allergies  Review of patient's allergies indicates no known allergies.  Home Medications   Prior to Admission medications   Medication Sig Start Date End Date Taking? Authorizing Provider  divalproex (DEPAKOTE) 500 MG DR tablet Take 1,000 mg by mouth 2 (two) times daily.   Yes Historical Provider, MD  chlorhexidine (PERIDEX) 0.12 % solution Use as directed 15 mLs in the mouth or throat 2 (two) times daily.  Patient not taking: Reported on 07/29/2014 05/19/14   Megan N Dort, PA-C  clindamycin (CLEOCIN) 75 MG/5ML solution Take 20 mLs (300 mg total) by mouth every 8 (eight) hours. Patient not taking: Reported on 07/29/2014 05/19/14   Megan N Dort, PA-C  oxyCODONE (ROXICODONE) 5 MG/5ML solution Take 10 mLs (10 mg total) by mouth every 6 (six) hours as needed for moderate pain or severe pain. Patient not taking: Reported on 07/29/2014 05/19/14   Rueben Bash  Dort, PA-C  Valproic Acid (DEPAKENE) 250 MG/5ML SYRP syrup Take 5 mLs (250 mg total) by mouth 4 (four) times daily. Patient not taking: Reported on 07/29/2014 05/19/14   Megan N Dort, PA-C   BP 135/93 mmHg  Pulse 73  Temp(Src) 98.2 F (36.8 C) (Oral)  Ht  (1.727 m)  Wt 172 lb (78.019 kg)  BMI 26.16 kg/m2  SpO2 100% Physical Exam  Physical examination:  Nursing notes reviewed; Vital signs and O2 SAT reviewed;  Constitutional: Well developed, Well nourished, Well hydrated, In no acute distress; Head:  Normocephalic, atraumatic; Eyes: EOMI, PERRL, No scleral icterus; ENMT: Mouth and pharynx normal, Mucous membranes moist; Neck: Supple, Full range of motion, No lymphadenopathy; Cardiovascular: Regular rate and rhythm, No murmur, rub, or gallop; Respiratory: Breath sounds clear & equal bilaterally, No rales, rhonchi, wheezes.  Speaking full sentences with ease, Normal respiratory effort/excursion; Chest: Nontender, Movement normal; Abdomen: Soft, Nontender, Nondistended, Normal bowel sounds; Genitourinary: No CVA tenderness; Extremities: Pulses normal, No tenderness, No edema, No calf edema or asymmetry.; Neuro: AA&Ox3, Major CN grossly intact. No facial droop. Speech clear. No gross focal motor or sensory deficits in extremities. Climbs on and off stretcher easily by himself. Gait steady.; Skin: Color normal, Warm, Dry.   ED Course  Procedures   MDM  MDM Reviewed: previous chart, nursing note and vitals Reviewed previous: labs Interpretation: labs      Results for orders placed or performed during the hospital encounter of 07/29/14  Valproic acid level  Result Value Ref Range   Valproic Acid Lvl 93.0 50.0 - 100.0 ug/mL     1315:  Depakote therapeutic, but pt is due his usual morning dose; will give in the ED. Will also give pt a new rx for his depakote. Pt remains at his baseline while in the ED.  Pt wants to go home now. Dx and testing d/w pt and family.  Questions answered.  Verb  understanding, agreeable to d/c home with outpt f/u.      Samuel Jester, DO 08/01/14 1551

## 2014-07-29 NOTE — ED Notes (Signed)
Seizure pads applied, family at [facility]

## 2014-09-01 ENCOUNTER — Ambulatory Visit: Payer: Self-pay | Admitting: Diagnostic Neuroimaging

## 2014-09-08 ENCOUNTER — Ambulatory Visit: Payer: Self-pay | Admitting: Diagnostic Neuroimaging

## 2014-09-12 ENCOUNTER — Ambulatory Visit (INDEPENDENT_AMBULATORY_CARE_PROVIDER_SITE_OTHER): Payer: Self-pay | Admitting: Diagnostic Neuroimaging

## 2014-09-12 ENCOUNTER — Encounter: Payer: Self-pay | Admitting: Diagnostic Neuroimaging

## 2014-09-12 VITALS — BP 126/85 | HR 71 | Ht 68.0 in | Wt 185.0 lb

## 2014-09-12 DIAGNOSIS — G40319 Generalized idiopathic epilepsy and epileptic syndromes, intractable, without status epilepticus: Secondary | ICD-10-CM

## 2014-09-12 MED ORDER — DIVALPROEX SODIUM 500 MG PO DR TAB: 500.0000 mg | DELAYED_RELEASE_TABLET | Freq: Three times a day (TID) | ORAL | Status: DC

## 2014-09-12 MED ORDER — LEVETIRACETAM 500 MG PO TABS: 500.0000 mg | ORAL_TABLET | Freq: Two times a day (BID) | ORAL | Status: DC

## 2014-09-12 NOTE — Patient Instructions (Signed)
Continue divalproex 500mg  three times per day.  Start levetiracetam 500mg  twice a day.
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
## 2014-11-12 ENCOUNTER — Encounter: Payer: Self-pay | Admitting: Diagnostic Neuroimaging

## 2014-11-12 ENCOUNTER — Ambulatory Visit (INDEPENDENT_AMBULATORY_CARE_PROVIDER_SITE_OTHER): Payer: Self-pay | Admitting: Diagnostic Neuroimaging

## 2014-11-12 VITALS — BP 124/84 | HR 60 | Ht 68.0 in | Wt 189.9 lb

## 2014-11-12 DIAGNOSIS — G40319 Generalized idiopathic epilepsy and epileptic syndromes, intractable, without status epilepticus: Secondary | ICD-10-CM

## 2014-11-12 MED ORDER — DIVALPROEX SODIUM 500 MG PO DR TAB: 500.0000 mg | DELAYED_RELEASE_TABLET | Freq: Three times a day (TID) | ORAL | Status: DC

## 2014-11-12 MED ORDER — LEVETIRACETAM 500 MG PO TABS: 500.0000 mg | ORAL_TABLET | Freq: Two times a day (BID) | ORAL | Status: DC
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
## 2014-11-12 NOTE — Patient Instructions (Signed)
Continue medications

## 2015-05-20 ENCOUNTER — Encounter: Payer: Self-pay | Admitting: Diagnostic Neuroimaging

## 2015-05-20 ENCOUNTER — Ambulatory Visit (INDEPENDENT_AMBULATORY_CARE_PROVIDER_SITE_OTHER): Payer: Self-pay | Admitting: Diagnostic Neuroimaging

## 2015-05-20 VITALS — BP 120/73 | HR 66 | Ht 68.0 in | Wt 187.4 lb

## 2015-05-20 DIAGNOSIS — G40319 Generalized idiopathic epilepsy and epileptic syndromes, intractable, without status epilepticus: Secondary | ICD-10-CM

## 2015-05-20 MED ORDER — DIVALPROEX SODIUM 500 MG PO DR TAB: 500.0000 mg | DELAYED_RELEASE_TABLET | Freq: Two times a day (BID) | ORAL | Status: DC

## 2015-05-20 MED ORDER — LEVETIRACETAM 1000 MG PO TABS: 1000.0000 mg | ORAL_TABLET | Freq: Two times a day (BID) | ORAL | Status: DC
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
## 2015-05-20 NOTE — Patient Instructions (Signed)
Thank you for coming to see [place] at [facility]. I hope we have been able to provide you high quality care today.  You may receive a patient satisfaction survey over the next few weeks. We would appreciate your feedback and comments so that we may continue to improve ourselves and the health of our patients.  - increase levetiracetam to 1018m twice a day - increase divalproex to 5072mtwice a day   ~~~~~~~~~~~~~~~~~~~~~~~~~~~~~~~~~~~~~~~~~~~~~~~~~~~~~~~~~~~~~~~~~  DR. Johnta Couts'S GUIDE TO HAPPY AND HEALTHY LIVING These are some of my general health and wellness recommendations. Some of them may apply to you better than others. Please use common sense as you try these suggestions and feel free to ask me any questions.   ACTIVITY/FITNESS Mental, social, emotional and physical stimulation are very important for brain and body health. Try learning a new activity (arts, music, language, sports, games).  Keep moving your body to the best of your abilities. You can do this at home, inside or outside, the park, community center, gym or anywhere you like. Consider a physical therapist or personal trainer to get started. Consider the app Sworkit. Fitness trackers such as smart-watches, smart-phones or Fitbits can help as well.   NUTRITION Eat more plants: colorful vegetables, nuts, seeds and berries.  Eat less sugar, salt, preservatives and processed foods.  Avoid toxins such as cigarettes and alcohol.  Drink water when you are thirsty. Warm water with a slice of lemon is an excellent morning drink to start the day.  Consider these websites for more information The Nutrition Source (hthttps://www.henry-hernandez.biz/Precision Nutrition (wwWindowBlog.ch  RELAXATION Consider practicing mindfulness meditation or other relaxation techniques such as deep breathing, prayer, yoga, tai chi, massage. See website mindful.org or the apps  Headspace or Calm to help get started.   SLEEP Try to get at least 7-8+ hours sleep per day. Regular exercise and reduced caffeine will help you sleep better. Practice good sleep hygeine techniques. See website sleep.org for more information.   PLANNING Prepare estate planning, living will, healthcare POA documents. Sometimes this is best planned with the help of an attorney. Theconversationproject.org and agingwithdignity.org are excellent resources.

## 2015-05-21 LAB — COMPREHENSIVE METABOLIC PANEL
A/G RATIO: 1.6 (ref 1.1–2.5)
ALBUMIN: 4.5 g/dL (ref 3.5–5.5)
ALK PHOS: 85 IU/L (ref 39–117)
ALT: 43 IU/L (ref 0–44)
AST: 42 IU/L — ABNORMAL HIGH (ref 0–40)
BILIRUBIN TOTAL: 0.3 mg/dL (ref 0.0–1.2)
BUN / CREAT RATIO: 9 (ref 9–20)
BUN: 8 mg/dL (ref 6–24)
CHLORIDE: 99 mmol/L (ref 96–106)
CO2: 24 mmol/L (ref 18–29)
CREATININE: 0.93 mg/dL (ref 0.76–1.27)
Calcium: 9.6 mg/dL (ref 8.7–10.2)
GFR calc Af Amer: 117 mL/min/{1.73_m2} (ref >59)
GFR calc non Af Amer: 102 mL/min/{1.73_m2} (ref >59)
GLOBULIN, TOTAL: 2.8 g/dL (ref 1.5–4.5)
Glucose: 92 mg/dL (ref 65–99)
POTASSIUM: 5 mmol/L (ref 3.5–5.2)
SODIUM: 139 mmol/L (ref 134–144)
Total Protein: 7.3 g/dL (ref 6.0–8.5)

## 2015-05-21 LAB — CBC WITH DIFFERENTIAL/PLATELET
Basophils Absolute: 0 10*3/uL (ref 0.0–0.2)
Basos: 1 %
EOS (ABSOLUTE): 0.3 10*3/uL (ref 0.0–0.4)
EOS: 4 %
HEMOGLOBIN: 14.2 g/dL (ref 12.6–17.7)
Hematocrit: 41.8 % (ref 37.5–51.0)
Immature Grans (Abs): 0 10*3/uL (ref 0.0–0.1)
Immature Granulocytes: 0 %
Lymphocytes Absolute: 2.4 10*3/uL (ref 0.7–3.1)
Lymphs: 30 %
MCH: 31.1 pg (ref 26.6–33.0)
MCHC: 34 g/dL (ref 31.5–35.7)
MCV: 92 fL (ref 79–97)
MONOCYTES: 8 %
MONOS ABS: 0.7 10*3/uL (ref 0.1–0.9)
NEUTROS ABS: 4.5 10*3/uL (ref 1.4–7.0)
Neutrophils: 57 %
Platelets: 276 10*3/uL (ref 150–379)
RBC: 4.57 x10E6/uL (ref 4.14–5.80)
RDW: 14.1 % (ref 12.3–15.4)
WBC: 8 10*3/uL (ref 3.4–10.8)

## 2015-05-21 LAB — VALPROIC ACID LEVEL: VALPROIC ACID LVL: 18 ug/mL — AB (ref 50–100)

## 2015-05-25 ENCOUNTER — Telehealth: Payer: Self-pay | Admitting: *Deleted

## 2015-05-25 NOTE — Telephone Encounter (Signed)
Reached patient's sister who requested this caller call [phone] to reach patient.  Unable to reach pt at that number, no voice mail available. Will call later.

## 2015-05-25 NOTE — Telephone Encounter (Signed)
Per Dr Marjory LiesPenumalli, spoke with patient and informed him that lab results are back, and Dr Marjory LiesPenumalli does not recommend further medication changes. [facility] and will give them new prescriptions from Dr Marjory LiesPenumalli. He verbalized understanding, appreciation.

## 2015-07-23 ENCOUNTER — Telehealth: Payer: Self-pay | Admitting: Diagnostic Neuroimaging

## 2015-07-23 MED ORDER — LEVETIRACETAM 1000 MG PO TABS: 1000.0000 mg | ORAL_TABLET | Freq: Two times a day (BID) | ORAL | Status: DC

## 2015-07-23 MED ORDER — DIVALPROEX SODIUM 500 MG PO DR TAB: 500.0000 mg | DELAYED_RELEASE_TABLET | Freq: Two times a day (BID) | ORAL | Status: DC

## 2015-07-23 NOTE — Telephone Encounter (Signed)
Patient is calling to get new Rx's for divalproex (DEPAKOTE) 500 MG DR tablet and levETIRAcetam (KEPPRA) 1000 MG tablet faxed to the health Department in [place]. Tel.# [phone], Fax# [phone]. They need new Rx's faxed because the dosages have changed. If there are any questions please contact the patient.

## 2015-07-27 NOTE — Telephone Encounter (Signed)
LVM requesting patient call back re: his request to send med refills to other pharmacy. Left this caller's name, number.

## 2015-07-27 NOTE — Telephone Encounter (Addendum)
Pt called to check on his prescriptions. They both were sent to [facility] but the pt had requested they be sent to the health dept. In [place]. Please call pt and advise [phone]

## 2015-07-27 NOTE — Telephone Encounter (Signed)
Spoke with pharmacist, Kennyth Arnold who stated patient has not been assisted through their pharmacy before. She stated their pharmacy does not stock the two medications he is requesting. She stated they have a very limited stock of medications . Will call patient to inform.

## 2015-07-28 ENCOUNTER — Encounter: Payer: Self-pay | Admitting: *Deleted

## 2015-07-28 MED ORDER — DIVALPROEX SODIUM 500 MG PO DR TAB: 500.0000 mg | DELAYED_RELEASE_TABLET | Freq: Two times a day (BID) | ORAL | Status: DC

## 2015-07-28 MED ORDER — LEVETIRACETAM 1000 MG PO TABS: 1000.0000 mg | ORAL_TABLET | Freq: Two times a day (BID) | ORAL | Status: DC

## 2015-07-28 NOTE — Telephone Encounter (Signed)
Patient is calling back and states that his Rx levEIRAcetam 1000 mg tablets should be faxed to [facility] -[phone].  Thanks!

## 2015-07-28 NOTE — Addendum Note (Signed)
Addended by: [place] Pink on: 07/28/2015 09:05 AM   Modules accepted: Orders

## 2015-07-28 NOTE — Telephone Encounter (Addendum)
Spoke with Helmut Muster, patient's caregiver who stated patient has information of pharmacy where refills need to be sent. She gave patient's cell # to call for info, [phone].  Reached patient and received pharmacy info: [facility] p [phone]  F [phone]. Informed him this RN will send his refills today. He verbalized understanding, appreciation.

## 2015-08-18 ENCOUNTER — Ambulatory Visit: Payer: Self-pay | Admitting: Diagnostic Neuroimaging

## 2015-08-19 ENCOUNTER — Encounter: Payer: Self-pay | Admitting: Diagnostic Neuroimaging

## 2017-08-19 ENCOUNTER — Emergency Department (HOSPITAL_COMMUNITY): Payer: Self-pay

## 2017-08-19 ENCOUNTER — Emergency Department (HOSPITAL_COMMUNITY)
Admission: EM | Admit: 2017-08-19 | Discharge: 2017-08-19 | Disposition: A | Discharge status: Home / Self Care | Payer: Self-pay | Attending: Emergency Medicine | Admitting: Emergency Medicine

## 2017-08-19 ENCOUNTER — Encounter (HOSPITAL_COMMUNITY): Payer: Self-pay | Admitting: Emergency Medicine

## 2017-08-19 ENCOUNTER — Other Ambulatory Visit: Payer: Self-pay

## 2017-08-19 DIAGNOSIS — Y33XXXA Other specified events, undetermined intent, initial encounter: Secondary | ICD-10-CM | POA: Insufficient documentation

## 2017-08-19 DIAGNOSIS — Z79899 Other long term (current) drug therapy: Secondary | ICD-10-CM | POA: Insufficient documentation

## 2017-08-19 DIAGNOSIS — F17210 Nicotine dependence, cigarettes, uncomplicated: Secondary | ICD-10-CM | POA: Insufficient documentation

## 2017-08-19 DIAGNOSIS — Y939 Activity, unspecified: Secondary | ICD-10-CM | POA: Insufficient documentation

## 2017-08-19 DIAGNOSIS — Y929 Unspecified place or not applicable: Secondary | ICD-10-CM | POA: Insufficient documentation

## 2017-08-19 DIAGNOSIS — S0993XA Unspecified injury of face, initial encounter: Secondary | ICD-10-CM

## 2017-08-19 DIAGNOSIS — S0181XA Laceration without foreign body of other part of head, initial encounter: Secondary | ICD-10-CM | POA: Insufficient documentation

## 2017-08-19 DIAGNOSIS — R569 Unspecified convulsions: Secondary | ICD-10-CM | POA: Insufficient documentation

## 2017-08-19 DIAGNOSIS — G40909 Epilepsy, unspecified, not intractable, without status epilepticus: Secondary | ICD-10-CM

## 2017-08-19 DIAGNOSIS — Y998 Other external cause status: Secondary | ICD-10-CM | POA: Insufficient documentation

## 2017-08-19 LAB — BASIC METABOLIC PANEL
Anion gap: 11 (ref 5–15)
BUN: 8 mg/dL (ref 6–20)
CALCIUM: 9.1 mg/dL (ref 8.9–10.3)
CHLORIDE: 108 mmol/L (ref 101–111)
CO2: 25 mmol/L (ref 22–32)
Creatinine, Ser: 0.94 mg/dL (ref 0.61–1.24)
GFR calc non Af Amer: >60 mL/min (ref >60)
Glucose, Bld: 90 mg/dL (ref 65–99)
Potassium: 3.5 mmol/L (ref 3.5–5.1)
SODIUM: 144 mmol/L (ref 135–145)

## 2017-08-19 LAB — RAPID URINE DRUG SCREEN, HOSP PERFORMED
Amphetamines: NOT DETECTED
Barbiturates: NOT DETECTED
Benzodiazepines: NOT DETECTED
Cocaine: NOT DETECTED
Opiates: NOT DETECTED
Tetrahydrocannabinol: NOT DETECTED

## 2017-08-19 LAB — CBC
HCT: 41.9 % (ref 39.0–52.0)
Hemoglobin: 13.8 g/dL (ref 13.0–17.0)
MCH: 29.8 pg (ref 26.0–34.0)
MCHC: 32.9 g/dL (ref 30.0–36.0)
MCV: 90.5 fL (ref 78.0–100.0)
Platelets: 311 10*3/uL (ref 150–400)
RBC: 4.63 MIL/uL (ref 4.22–5.81)
RDW: 14 % (ref 11.5–15.5)
WBC: 8.1 10*3/uL (ref 4.0–10.5)

## 2017-08-19 LAB — ETHANOL: ALCOHOL ETHYL (B): 214 mg/dL — AB (ref <10)

## 2017-08-19 LAB — VALPROIC ACID LEVEL: Valproic Acid Lvl: <10 ug/mL — ABNORMAL LOW (ref 50.0–100.0)

## 2017-08-19 MED ORDER — DIVALPROEX SODIUM 250 MG PO DR TAB
500.0000 mg | DELAYED_RELEASE_TABLET | Freq: Two times a day (BID) | ORAL | Status: DC
  Administered 2017-08-19: 500 mg via ORAL
  Filled 2017-08-19: qty 2
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
## 2017-08-19 NOTE — ED Triage Notes (Signed)
Pt has been drinking beer today and states he fell during the seizure that fell face forward.
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
## 2017-09-12 ENCOUNTER — Encounter (HOSPITAL_COMMUNITY): Payer: Self-pay | Admitting: Cardiology

## 2017-09-12 ENCOUNTER — Emergency Department (HOSPITAL_COMMUNITY)
Admission: EM | Admit: 2017-09-12 | Discharge: 2017-09-12 | Disposition: A | Discharge status: Home / Self Care | Payer: Self-pay | Attending: Emergency Medicine | Admitting: Emergency Medicine

## 2017-09-12 DIAGNOSIS — Z9114 Patient's other noncompliance with medication regimen: Secondary | ICD-10-CM | POA: Insufficient documentation

## 2017-09-12 DIAGNOSIS — F17210 Nicotine dependence, cigarettes, uncomplicated: Secondary | ICD-10-CM | POA: Insufficient documentation

## 2017-09-12 DIAGNOSIS — Z79899 Other long term (current) drug therapy: Secondary | ICD-10-CM | POA: Insufficient documentation

## 2017-09-12 DIAGNOSIS — R569 Unspecified convulsions: Secondary | ICD-10-CM | POA: Insufficient documentation

## 2017-09-12 MED ORDER — LEVETIRACETAM IN NACL 1000 MG/100ML IV SOLN
1000.0000 mg | Freq: Once | INTRAVENOUS | Status: AC
  Administered 2017-09-12: 1000 mg via INTRAVENOUS
  Filled 2017-09-12: qty 100

## 2017-09-12 MED ORDER — LEVETIRACETAM 500 MG PO TABS
2000.0000 mg | ORAL_TABLET | Freq: Once | ORAL | Status: AC
  Administered 2017-09-12: 2000 mg via ORAL
  Filled 2017-09-12: qty 4

## 2017-09-12 MED ORDER — ACETAMINOPHEN 325 MG PO TABS
650.0000 mg | ORAL_TABLET | Freq: Once | ORAL | Status: AC
  Administered 2017-09-12: 650 mg via ORAL
  Filled 2017-09-12: qty 2

## 2017-09-12 NOTE — ED Notes (Signed)
Resting with eyes shut.  Easily arouses.
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
## 2017-09-12 NOTE — Discharge Instructions (Signed)
Take 1000 mg of your keppra tonight and another dose in the morning so you do not miss any doses of your seizure medicine prior to your appointment with your doctor.

## 2017-09-12 NOTE — ED Triage Notes (Addendum)
EMS called for seizure activity this morning.  Pt had 3 seizures PTA that were witnessed by his girlfriend.  Pt had a seizure after EMS arrived.  EMS gave 2.5 mg of versed IV.   Pt has not had Keppra in 2 days.  [facility]ill.    CBG 94.  Pt awakens easy upon arrival to the ED.  Oriented.

## 2019-08-14 IMAGING — CT CT MAXILLOFACIAL W/O CM
3 of 7 series · 13 of 47 positions shown, 15 images · non-contrast
Comparison: CT head and CT cervical spine May 16, 2014

CLINICAL DATA: Seizure with fall

EXAM:
CT HEAD WITHOUT CONTRAST
CT MAXILLOFACIAL WITHOUT CONTRAST
TECHNIQUE: Multidetector CT imaging of the head and maxillofacial structures
were performed using the standard protocol without intravenous
contrast. Multiplanar CT image reconstructions of the maxillofacial
structures were also generated.

[Series 3: head bone · axial · 0.42mm/px · z∈[+21,+143]mm · 8 of 75 slices shown, 10 images]
[im 7/75  brain]
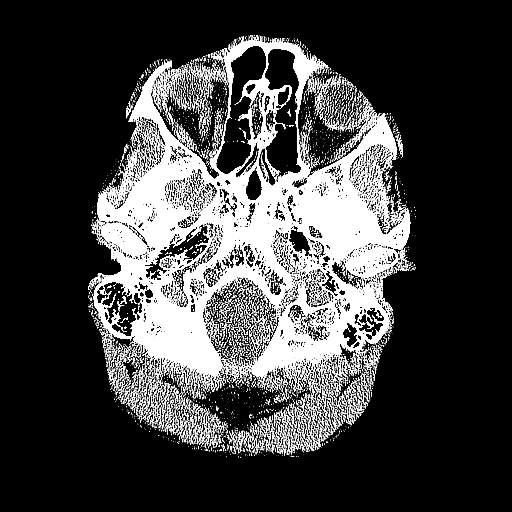
[im 7/75  bone]
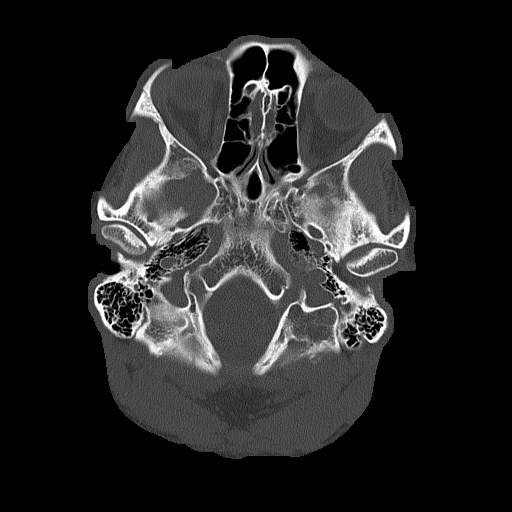
[im 14/75  bone]
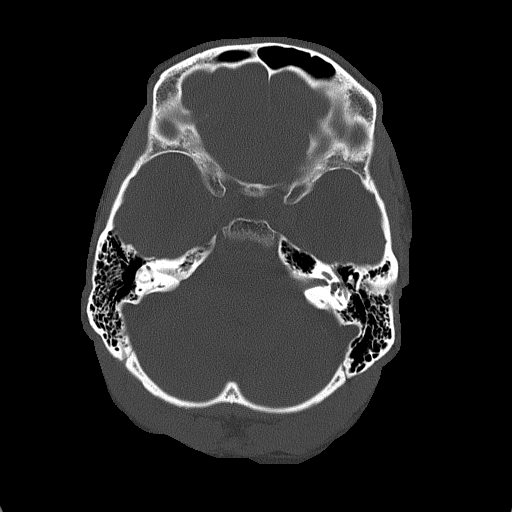
[im 27/75  bone]
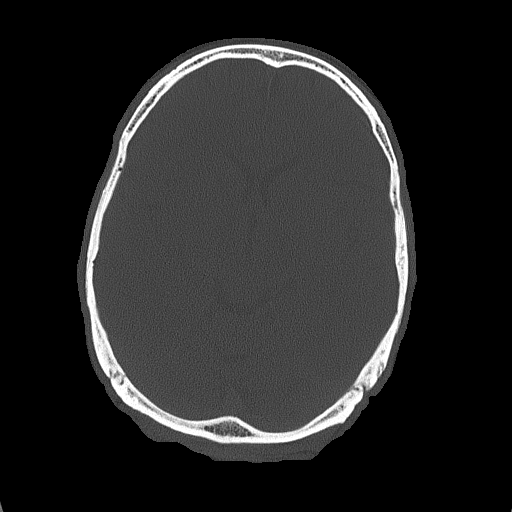
[im 34/75  bone]
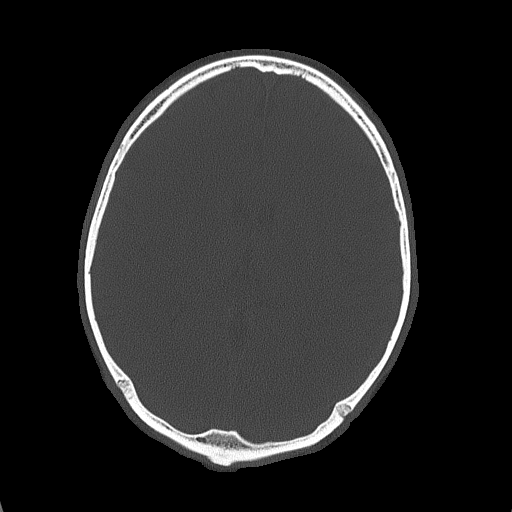
[im 41/75  brain]
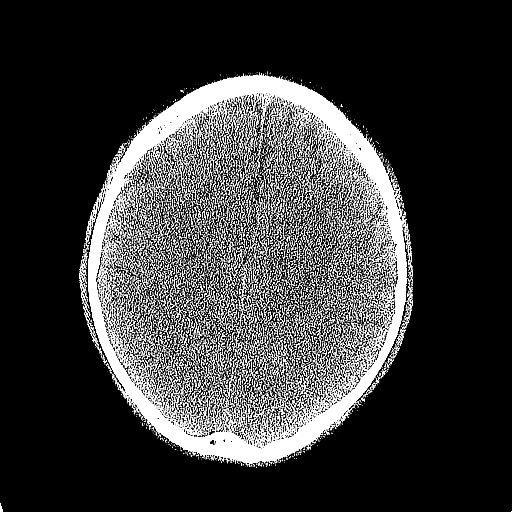
[im 41/75  bone]
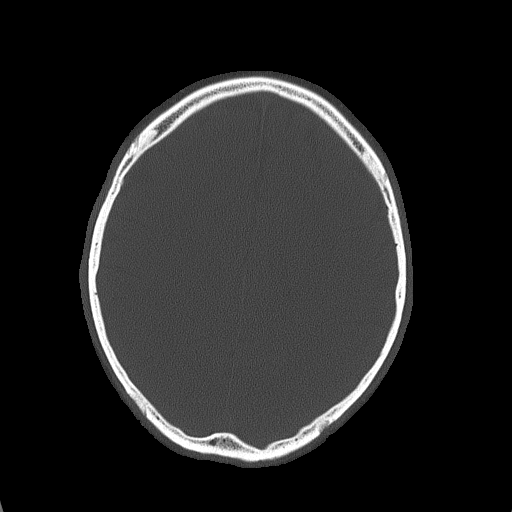
[im 48/75  bone]
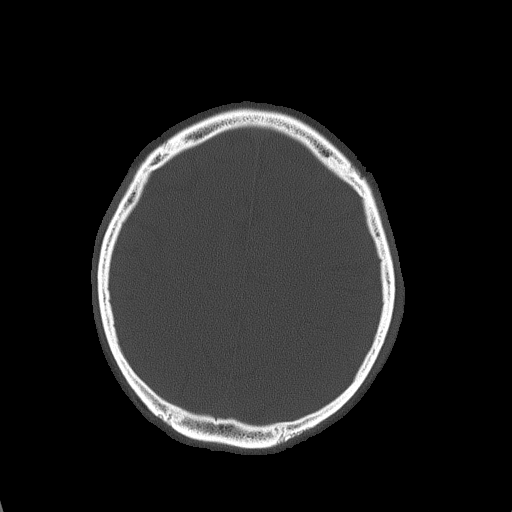
[im 61/75  bone]
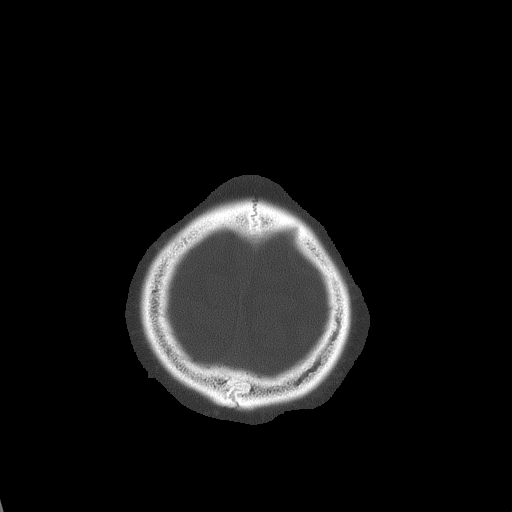
[im 68/75  bone]
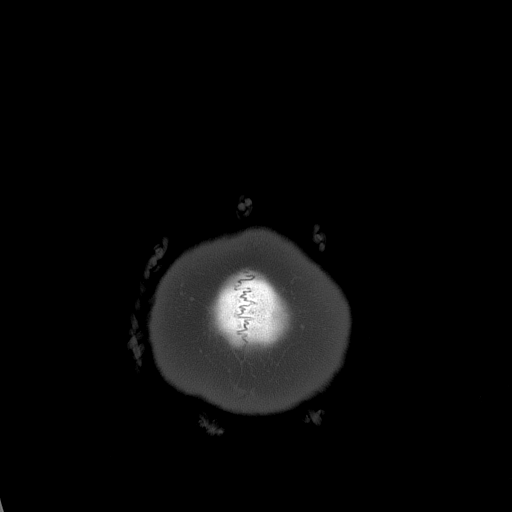

[Series 4: coronal soft tissue · coronal · 0.29mm/px · 3 of 69 slices shown]
[im 18/69  bone]
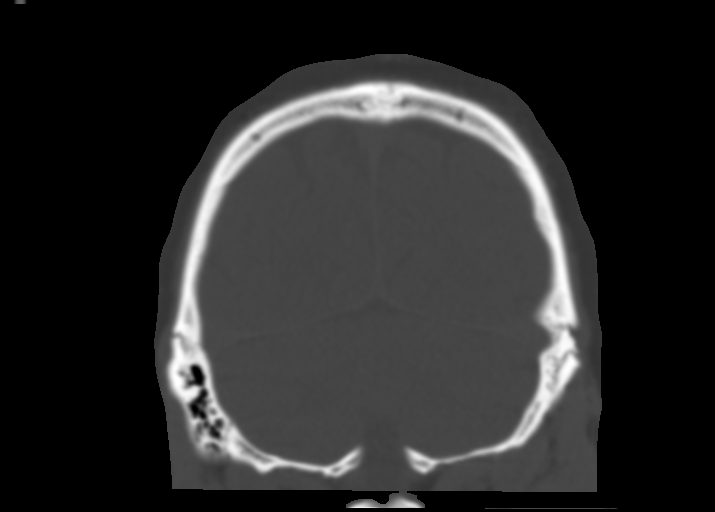
[im 35/69  bone]
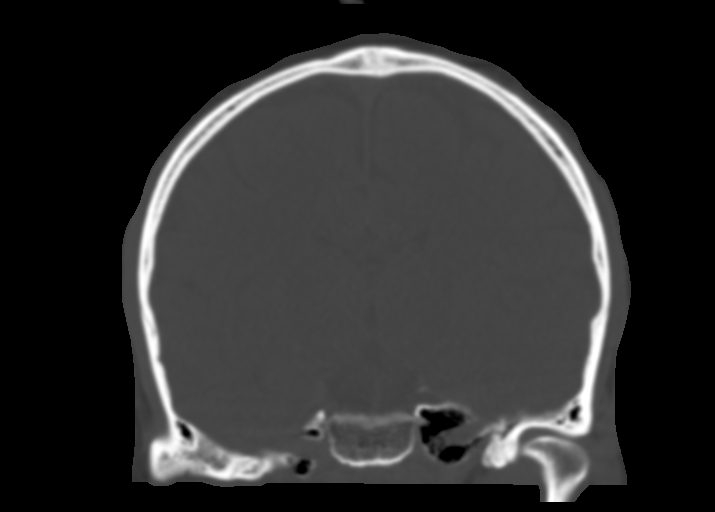
[im 52/69  bone]
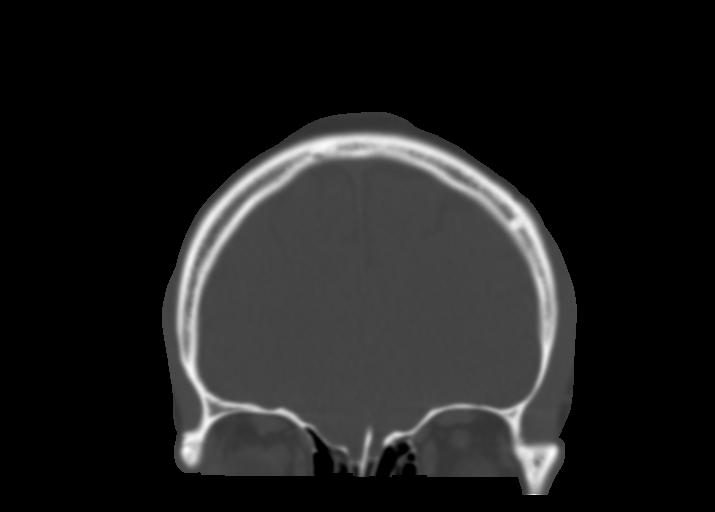

[Series 11: sagittal soft · sagittal · 0.37mm/px · 2 of 86 slices shown]
[im 29/86  bone]
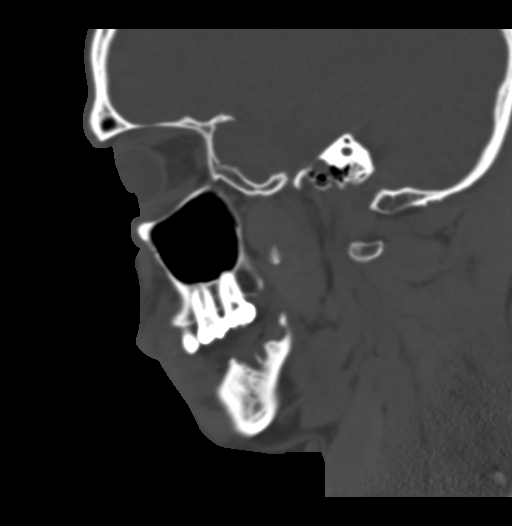
[im 57/86  bone]
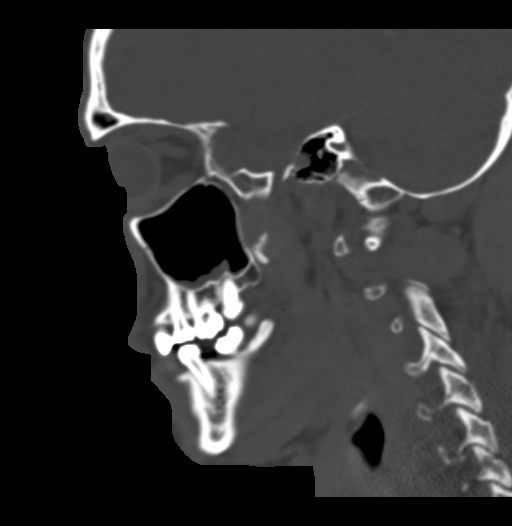

[13 of 47 positions shown; findings below may reference images not displayed]

FINDINGS: CT HEAD FINDINGS

Brain: The ventricles are normal in size and configuration. There is
no intracranial mass, hemorrhage, extra-axial fluid collection, or
midline shift. The gray-white compartments appear normal. No evident
acute infarct.

Vascular: No hyperdense vessels.  No vascular calcification evident.

Skull: The bony calvarium appears intact. There is a bandage
overlying the left frontal sinus anteriorly. There is no significant
soft tissue swelling in this area by CT.

Other: Mastoid air cells are clear.

CT MAXILLOFACIAL FINDINGS

Osseous: There is evidence of an old healed fracture of the right
mandibular angle with alignment anatomic. There is evidence of a
prior fracture in the midline of the mandible with bony overgrowth.
A small amount of residual lucency remains in this area, although
there is callus in this area indicating healing. This lucency does
raise possibility of a degree of re-injury in this area. There is a
chronic area of lucency in the posterosuperior alveolar ridge on the
right, felt to be due to chronic dental disease. No fracture is
evident in this area. Elsewhere, no evidence of acute fracture or
dislocation. Bony structures elsewhere appear intact.

Orbits: Orbits appear symmetric bilaterally. No intraorbital lesions
are demonstrated.

Sinuses: There is mucosal thickening in several areas within the
left maxillary antrum. There is mucosal thickening in several
ethmoid air cells bilaterally. Other paranasal sinuses are clear.
Ostiomeatal unit complexes are patent bilaterally. There is minimal
rightward deviation of the nasal septum.

Soft tissues: There is soft tissue edema in the lower to mid left
face without well-defined hematoma. No abscess evident. No soft
tissue mass appreciable.

Salivary glands appear symmetric bilaterally. No adenopathy. Tongue
and tongue base regions appear normal. Visualized pharynx appears
normal. There is degenerative change in the mid cervical spine.
IMPRESSION: CT head: Gray-white compartments appear normal. No mass or
hemorrhage. Bandage over left frontal sinus region. No calvarial
fracture evident.

CT maxillofacial:

1. Prior fracture midline of mandible with subtle lucency in this
area. This lucency may represent residua of previous fracture,
although a recurrent acute injury superimposed cannot be excluded at
this time. Alignment in this area is anatomic. No other evidence
suggesting fracture elsewhere.

2. Lucency in the posterior right superior alveolar ridge is likely
due to chronic dental disease. This appearance is stable.

3. Areas of mild paranasal sinus disease. Ostiomeatal unit complexes
are patent bilaterally. Minimal rightward deviation of nasal septum.

4.  Mild soft tissue swelling left face.

5.  No intraorbital lesions.

## 2021-10-24 ENCOUNTER — Inpatient Hospital Stay (HOSPITAL_BASED_OUTPATIENT_CLINIC_OR_DEPARTMENT_OTHER)
Admission: EM | Admit: 2021-10-24 | Discharge: 2021-10-25 | DRG: 101 | Disposition: A | Discharge status: Home / Self Care | Payer: Self-pay | Attending: Family Medicine | Admitting: Family Medicine

## 2021-10-24 ENCOUNTER — Other Ambulatory Visit: Payer: Self-pay

## 2021-10-24 ENCOUNTER — Emergency Department (HOSPITAL_BASED_OUTPATIENT_CLINIC_OR_DEPARTMENT_OTHER): Payer: Self-pay

## 2021-10-24 ENCOUNTER — Inpatient Hospital Stay (HOSPITAL_COMMUNITY): Payer: Self-pay

## 2021-10-24 ENCOUNTER — Encounter (HOSPITAL_BASED_OUTPATIENT_CLINIC_OR_DEPARTMENT_OTHER): Payer: Self-pay | Admitting: *Deleted

## 2021-10-24 DIAGNOSIS — Z91120 Patient's intentional underdosing of medication regimen due to financial hardship: Secondary | ICD-10-CM

## 2021-10-24 DIAGNOSIS — E872 Acidosis, unspecified: Secondary | ICD-10-CM | POA: Diagnosis present

## 2021-10-24 DIAGNOSIS — Y92009 Unspecified place in unspecified non-institutional (private) residence as the place of occurrence of the external cause: Secondary | ICD-10-CM

## 2021-10-24 DIAGNOSIS — S0990XA Unspecified injury of head, initial encounter: Secondary | ICD-10-CM

## 2021-10-24 DIAGNOSIS — Y92002 Bathroom of unspecified non-institutional (private) residence single-family (private) house as the place of occurrence of the external cause: Secondary | ICD-10-CM

## 2021-10-24 DIAGNOSIS — R000 Tachycardia, unspecified: Secondary | ICD-10-CM | POA: Diagnosis present

## 2021-10-24 DIAGNOSIS — S0003XA Contusion of scalp, initial encounter: Secondary | ICD-10-CM | POA: Diagnosis present

## 2021-10-24 DIAGNOSIS — Z597 Insufficient social insurance and welfare support: Secondary | ICD-10-CM

## 2021-10-24 DIAGNOSIS — Z720 Tobacco use: Secondary | ICD-10-CM | POA: Diagnosis present

## 2021-10-24 DIAGNOSIS — F1010 Alcohol abuse, uncomplicated: Secondary | ICD-10-CM | POA: Diagnosis present

## 2021-10-24 DIAGNOSIS — D72829 Elevated white blood cell count, unspecified: Secondary | ICD-10-CM | POA: Diagnosis present

## 2021-10-24 DIAGNOSIS — Z8249 Family history of ischemic heart disease and other diseases of the circulatory system: Secondary | ICD-10-CM

## 2021-10-24 DIAGNOSIS — Z833 Family history of diabetes mellitus: Secondary | ICD-10-CM

## 2021-10-24 DIAGNOSIS — F17210 Nicotine dependence, cigarettes, uncomplicated: Secondary | ICD-10-CM | POA: Diagnosis present

## 2021-10-24 DIAGNOSIS — E8729 Other acidosis: Secondary | ICD-10-CM | POA: Diagnosis present

## 2021-10-24 DIAGNOSIS — T426X6A Underdosing of other antiepileptic and sedative-hypnotic drugs, initial encounter: Secondary | ICD-10-CM | POA: Diagnosis present

## 2021-10-24 DIAGNOSIS — R519 Headache, unspecified: Secondary | ICD-10-CM

## 2021-10-24 DIAGNOSIS — R569 Unspecified convulsions: Principal | ICD-10-CM

## 2021-10-24 DIAGNOSIS — W2209XA Striking against other stationary object, initial encounter: Secondary | ICD-10-CM | POA: Diagnosis present

## 2021-10-24 DIAGNOSIS — G40909 Epilepsy, unspecified, not intractable, without status epilepticus: Principal | ICD-10-CM

## 2021-10-24 LAB — COMPREHENSIVE METABOLIC PANEL
ALT: 11 U/L (ref 0–44)
AST: 28 U/L (ref 15–41)
Albumin: 3.8 g/dL (ref 3.5–5.0)
Alkaline Phosphatase: 60 U/L (ref 38–126)
Anion gap: 17 — ABNORMAL HIGH (ref 5–15)
BUN: 14 mg/dL (ref 6–20)
CO2: 11 mmol/L — ABNORMAL LOW (ref 22–32)
Calcium: 7.9 mg/dL — ABNORMAL LOW (ref 8.9–10.3)
Chloride: 107 mmol/L (ref 98–111)
Creatinine, Ser: 1.05 mg/dL (ref 0.61–1.24)
GFR, Estimated: >60 mL/min (ref >60)
Glucose, Bld: 106 mg/dL — ABNORMAL HIGH (ref 70–99)
Potassium: 4.8 mmol/L (ref 3.5–5.1)
Sodium: 135 mmol/L (ref 135–145)
Total Bilirubin: 0.5 mg/dL (ref 0.3–1.2)
Total Protein: 6.7 g/dL (ref 6.5–8.1)

## 2021-10-24 LAB — CBC WITH DIFFERENTIAL/PLATELET
Abs Immature Granulocytes: 0.04 10*3/uL (ref 0.00–0.07)
Basophils Absolute: 0.1 10*3/uL (ref 0.0–0.1)
Basophils Relative: 0 %
Eosinophils Absolute: 0.1 10*3/uL (ref 0.0–0.5)
Eosinophils Relative: 1 %
HCT: 35.1 % — ABNORMAL LOW (ref 39.0–52.0)
Hemoglobin: 11.6 g/dL — ABNORMAL LOW (ref 13.0–17.0)
Immature Granulocytes: 0 %
Lymphocytes Relative: 11 %
Lymphs Abs: 1.4 10*3/uL (ref 0.7–4.0)
MCH: 30 pg (ref 26.0–34.0)
MCHC: 33 g/dL (ref 30.0–36.0)
MCV: 90.7 fL (ref 80.0–100.0)
Monocytes Absolute: 1.2 10*3/uL — ABNORMAL HIGH (ref 0.1–1.0)
Monocytes Relative: 10 %
Neutro Abs: 9.5 10*3/uL — ABNORMAL HIGH (ref 1.7–7.7)
Neutrophils Relative %: 78 %
Platelets: 205 10*3/uL (ref 150–400)
RBC: 3.87 MIL/uL — ABNORMAL LOW (ref 4.22–5.81)
RDW: 13.9 % (ref 11.5–15.5)
WBC: 12.2 10*3/uL — ABNORMAL HIGH (ref 4.0–10.5)
nRBC: 0 % (ref 0.0–0.2)

## 2021-10-24 LAB — RAPID URINE DRUG SCREEN, HOSP PERFORMED
Amphetamines: NOT DETECTED
Barbiturates: NOT DETECTED
Benzodiazepines: POSITIVE — AB
Cocaine: NOT DETECTED
Opiates: NOT DETECTED
Tetrahydrocannabinol: NOT DETECTED

## 2021-10-24 LAB — MAGNESIUM: Magnesium: 2.1 mg/dL (ref 1.7–2.4)

## 2021-10-24 LAB — GLUCOSE, CAPILLARY: Glucose-Capillary: 92 mg/dL (ref 70–99)

## 2021-10-24 MED ORDER — ACETAMINOPHEN 325 MG PO TABS
650.0000 mg | ORAL_TABLET | Freq: Four times a day (QID) | ORAL | Status: DC | PRN
Start: 2021-10-24 — End: 2021-10-25

## 2021-10-24 MED ORDER — M.V.I. ADULT IV INJ
INJECTION | Freq: Once | INTRAVENOUS | Status: AC
  Filled 2021-10-24: qty 1000

## 2021-10-24 MED ORDER — LEVETIRACETAM IN NACL 1000 MG/100ML IV SOLN
1000.0000 mg | Freq: Once | INTRAVENOUS | Status: AC
  Administered 2021-10-24: 1000 mg via INTRAVENOUS
  Filled 2021-10-24: qty 100

## 2021-10-24 MED ORDER — LEVETIRACETAM 750 MG PO TABS
1250.0000 mg | ORAL_TABLET | Freq: Two times a day (BID) | ORAL | Status: DC
  Administered 2021-10-25: 1250 mg via ORAL
  Filled 2021-10-24: qty 1

## 2021-10-24 MED ORDER — LORAZEPAM 2 MG/ML IJ SOLN: 1.0000 mg | INTRAMUSCULAR | Status: DC | PRN

## 2021-10-24 MED ORDER — SODIUM CHLORIDE 0.9 % IV BOLUS
1000.0000 mL | Freq: Once | INTRAVENOUS | Status: AC
Start: 2021-10-24 — End: 2021-10-24
  Administered 2021-10-24: 1000 mL via INTRAVENOUS

## 2021-10-24 MED ORDER — DIPHENHYDRAMINE HCL 50 MG/ML IJ SOLN
50.0000 mg | Freq: Once | INTRAMUSCULAR | Status: AC
  Administered 2021-10-24: 50 mg via INTRAVENOUS
  Filled 2021-10-24: qty 1

## 2021-10-24 MED ORDER — PROCHLORPERAZINE EDISYLATE 10 MG/2ML IJ SOLN
10.0000 mg | Freq: Once | INTRAMUSCULAR | Status: AC
  Administered 2021-10-24: 10 mg via INTRAVENOUS
  Filled 2021-10-24: qty 2

## 2021-10-24 MED ORDER — NICOTINE 14 MG/24HR TD PT24: 14.0000 mg | MEDICATED_PATCH | Freq: Every day | TRANSDERMAL | Status: DC | PRN

## 2021-10-24 MED ORDER — LORAZEPAM 2 MG/ML IJ SOLN
INTRAMUSCULAR | Status: AC
  Administered 2021-10-24: 2 mg
  Filled 2021-10-24: qty 1

## 2021-10-24 MED ORDER — ACETAMINOPHEN 650 MG RE SUPP: 650.0000 mg | Freq: Four times a day (QID) | RECTAL | Status: DC | PRN

## 2021-10-24 NOTE — Assessment & Plan Note (Addendum)
Patient experienced 4 seizures on day of admission, followed by 5th in the ER.  In the setting of decreased sleep, increased EtOH and increased life stress of impending move, as well as at least one missed dose of medication and no regular Neurology follow up.  Here, Neurology evaluated patient, recommended EEG, increase in Keppra.  UA, CXR unremarkable.  UDS showed benzos, which patient was given in the ER. - Continue Keppra - Follow EEG

## 2021-10-24 NOTE — Assessment & Plan Note (Signed)
 #)   Hypocalcemia: Presenting labs reflect calcium of 7.9, without need for albumin associated adjustment.  We will confirm the absence of hypomagnesemia prior to supplementing via calcium gluconate in order to reduce risk for iatrogenic hypomagnesia Mia as a consequence of calcium gluconate supplementation, particularly given increased risk for seizures as a consequence of hypomagnesemia.  Plan: Add on serum magnesium level.  Check ionized calcium level, serum phosphorus level.  Repeat CMP in the morning.

## 2021-10-24 NOTE — Assessment & Plan Note (Signed)
 #)   Chronic Alcohol Abuse: Daily alcohol consumption, as further quantified above, with reported most recent alcohol consumed on 10/23/2021.  Elevated history given the patient's presenting seizures, although he does not appear to be physical exam, clinical suggestion, or vital signs consistent with overt alcohol withdrawal.  We will closely monitor for such but will refrain from formal initiation of CIWA in order to reduce risk of masking seizures as a result of potential Ativan received as a consequence of his symptoms based approach.   Plan: counseled the patient on the importance of reduction in alcohol consumption. Consult to transition of care team placed. Close monitoring of ensuing BP and HR via routine VS. Seizure precautions. Telemetry. Add-on serum Mg level. Check serum phosphorus level. Repeat CMP in the morning. Check INR. Add-on serum ethanol level. UDS. Banana bag x1.

## 2021-10-24 NOTE — H&P (Addendum)
History and Physical    PLEASE NOTE THAT DRAGON DICTATION SOFTWARE WAS USED IN THE CONSTRUCTION OF THIS NOTE.   Dalton [name] [id number] DOB: [birth year] DOA: 10/24/2021  PCP: Patient, No Pcp Per (Inactive) (will further assess) Patient coming from: home   I have personally briefly reviewed patient's old medical records in [place]  Chief Complaint: seizures  HPI: Dalton [name] is a 46 y.o. male with medical history significant for seizures, current tobacco abuse, who is admitted to [facility] on 10/24/2021 by way of transfer from [facility] emergency department with breakthrough seizures after presenting from home to [facility] ED complaining of multiple seizure episodes over the course the last.   Following history is provided by the patient, his wife, and via chart review.  In the context of a documented history of previous seizures, for which the patient is prescribed Keppra 1000 mg p.o. twice daily, he presents to the [facility] emergency department for evaluation of 3 separate episodes of generalized tonic-clonic seizure activity over the preceding 12 hours, noting the tonic-clonic activity to involve all 4 extremities.  These episodes were witnessed by the patient's wife, who notes that the duration of the first 2 episodes of seizures or approximately 10 to 20 seconds, with the third episode of tonic-clonic activity lasting slightly longer, estimated duration to been 1 to 2 minutes.  Each of these 3 seizures leading up to presentation to the [facility] emergency department today spontaneously terminated, in the absence of pharmacologic intervention.  These episodes do not appear to have been associated with any tongue biting, loss of bowel/bladder function.  However, each episode was followed by a brief period of somnolence, diminished responsiveness, lasting for several minutes, before the patient returned back to baseline mental status.   The third episode of generalized  tonic-clonic activity occurred while the patient was standing in the bathroom, resulting in a fall to the bathroom floor, during which she reportedly hit the left side of his head on the bathtub.  Aside from this, the patient denies any preceding trauma in the weeks leading up to these three episodes of tonic-clonic activity.   Patient does not actively follow with outpatient neurology, and confirms his intended epileptic regimen consists of Keppra 1000 mg p.o. twice daily, noting no recent modifications to this outpatient regimen.  He acknowledges a degree of suboptimal compliance with his Keppra regimen, stating that he does occasionally miss doses of his scheduled Keppra, reporting most recently missed doses to occur yesterday.  Has not recently been on any additional antiepileptic medications.  He reports that he typically consumes alcohol on a daily basis, noting typical consumption of 2 to 3 twelve ounce beers per day.  Most recent alcohol consumption occurred on 10/23/2021, denies any prior history of alcohol withdrawal symptoms, or any history of known alcohol withdrawal seizures.  Denies any recent recreational drug use, including no abuse of stimulant class recreational drugs, eluding no use of cocaine, methamphetamine, or amphetamines.  Denies any chronic or recent use of benzodiazepines, or antidepressant medications, including no recent use of SSRIs or SNRIs.  No known history of diabetes, does not take insulin or sulfonylureas as an outpatient.  Today's episodes of tonic-clonic activity have not been associated with any ensuing or recently preceding acute focal weakness, acute focal numbness, paresthesias, slurred speech, facial droop, vertigo, nausea, vomiting, or acute change in vision.  He notes a mild left parietal headache that started after striking his head on the bathtub as a component  of loss of consciousness associated with the third episode of generalized tonic-clonic activity at home  earlier today.  Otherwise, denies any recent headaches, including no headaches leading up to this third episode of tonic-clonic activity.  No ensuing neck pain or neck stiffness.  Denies any associated rash.   No recent subjective fever, chills, rigors, or generalized myalgias. No recent sore throat, sob, wheezing, cough, abdominal pain, diarrhea.  denies any recent dysuria, gross hematuria, or change in urinary urgency/frequency.   He conveys a recent increase in life stressors in the form of plans for an impending move.  In order to compensate for the additional time requirements associated with this additional workload, the patient reports that he has been sleeping less over the preceding weeks to attend to these additional work demands.   Following the third episode of generalized tonic-clonic activity at home on the morning of 10/24/2021, the patient presented to [facility] emergency department for further evaluation management of the seizures.    [facility] ED Course:  Vital signs in the ED were notable for the following: Afebrile; heart rate 61-81; blood pressure 101/64 - 131/95; respiratory rate 15-20, oxygen saturation 99 to 100% on room air.  Labs were notable for the following: CMP notable for the following: Sodium 135, bicarbonate 11, anion gap 17, creatinine 1.05, glucose 106, calcium 7.9, albumin 3.8, additional liver enzymes within normal limits.  CBC notable for white cell count 12,200 with 70% neutrophils.  Urinary drug screen ordered, with result currently pending.  Imaging and additional notable ED work-up: EKG shows sinus tachycardia with heart rate 100, normal intervals, no evidence of T wave changes, and less than 1 mm ST elevation in nonconsecutive leads, specifically noted to be present in V1, V3.  Noncontrast CT head shows no evidence of acute intracranial abnormality, including no evidence of intracranial hemorrhage nor any evidence of acute infarct.  CT head also shows  vertex scalp hematoma, without evidence of skull fracture.  Plans were underway for discharge to home from the ED when the patient experienced a fourth episode of seizures, as witnessed by staff, involving generalized tonic-clonic activity involving all 4 extremities lasting for approximately 2 minutes, during which time he received 2 mg of IV Ativan following which seizure terminated, without subsequent recurrence.  There was noted to be some diminished responsiveness following this episode, which is subsequently improved.  EDP discussed patient's case with the on-call neurologist, Dr. Amada Jupiter, Who recommended admission to the hospital service at [facility] with neurology consultation and EEG following Keppra load to be administered at [facility].  While in the [facility] ED, the following were administered: Benadryl 50 mg IV x1, Compazine 10 mg IV x1, Ativan 2 mg IV x1, Keppra 1 g IV x2 doses, normal saline x1 L bolus.  Subsequently, the patient was admitted to [facility] for further evaluation and management of seizures, with presenting labs also notable for hypocalcemia, anion gap metabolic acidosis, and mild leukocytosis.     Review of Systems: As per HPI otherwise 10 point review of systems negative.   Past Medical History:  Diagnosis Date   Noncompliance with medications    Seizures (HCC)     Past Surgical History:  Procedure Laterality Date   CLOSED REDUCTION MANDIBLE WITH MANDIBULOMA N/A 05/17/2014   Procedure: CLOSED REDUCTION MANDIBLE WITH MANDIBULOMAXILLARY FUSION (MMF SCREWS)  REPAIR OF LOWER LIP LACERATION;  Surgeon: Darletta Moll, MD;  Location: [facility] OR;  Service: ENT;  Laterality: N/A;   FIBULA FRACTURE SURGERY  MANDIBULAR HARDWARE REMOVAL Bilateral 06/30/2014   Procedure: MANDIBULAR/MAXILLARY HARDWARE REMOVAL;  Surgeon: Ascencion Dike, MD;  Location: [place];  Service: ENT;  Laterality: Bilateral;   WOUND EXPLORATION Left 05/17/2014   Procedure: LEFT HAND   HAND WOUND EXPLORATION REPAIR AS INDIDATED;  Surgeon: Ascencion Dike, MD;  Location: [place];  Service: ENT;  Laterality: Left;   WOUND EXPLORATION Left 05/17/2014   Procedure: LEFT HAND WOUND EXPLORATION REPAIR WITH TENDON AND MUSCLE REPAIR ;  Surgeon: Linna Hoff, MD;  Location: [place];  Service: Orthopedics;  Laterality: Left;    Social History:  reports that he has been smoking cigarettes. He has a 6.50 pack-year smoking history. He has never used smokeless tobacco. He reports current alcohol use. He reports that he does not use drugs.   No Known Allergies  Family History  Problem Relation Age of Onset   Diabetes Mellitus II Mother    Cancer Mother    Cancer Father    Hypertension Father     Family history reviewed and not pertinent    Prior to Admission medications   Medication Sig Start Date End Date Taking? Authorizing Provider  levETIRAcetam (KEPPRA) 1000 MG tablet Take 1 tablet (1,000 mg total) by mouth 2 (two) times daily. 07/28/15   Penni Bombard, MD     Objective    Physical Exam: Vitals:   10/24/21 1630 10/24/21 1745 10/24/21 1854 10/24/21 1952  BP: (!) 122/92 (!) 135/94 (!) 144/97 (!) 135/93  Pulse: 67 63 71 63  Resp:   20 20  Temp:   98.8 F (37.1 C) 98.4 F (36.9 C)  TempSrc:   Oral Oral  SpO2: 100% 100% 100% 100%  Weight:      Height:        General: appears to be stated age; alert, oriented Skin: warm, dry, no rash Head:  AT/[place] Mouth:  Oral mucosa membranes appear moist, normal dentition Neck: supple; trachea midline Heart:  RRR; did not appreciate any M/R/G Lungs: CTAB, did not appreciate any wheezes, rales, or rhonchi Abdomen: + BS; soft, ND, NT Vascular: 2+ pedal pulses b/l; 2+ radial pulses b/l Extremities: no peripheral edema, no muscle wasting Neuro: strength and sensation intact in upper and lower extremities b/l   Labs on Admission: I have personally reviewed following labs and imaging studies  CBC: Recent Labs  Lab  10/24/21 1540  WBC 12.2*  NEUTROABS 9.5*  HGB 11.6*  HCT 35.1*  MCV 90.7  PLT 99991111   Basic Metabolic Panel: Recent Labs  Lab 10/24/21 1323  NA 135  K 4.8  CL 107  CO2 11*  GLUCOSE 106*  BUN 14  CREATININE 1.05  CALCIUM 7.9*   GFR: Estimated Creatinine Clearance: 84.1 mL/min (by C-G formula based on SCr of 1.05 mg/dL). Liver Function Tests: Recent Labs  Lab 10/24/21 1323  AST 28  ALT 11  ALKPHOS 60  BILITOT 0.5  PROT 6.7  ALBUMIN 3.8   No results for input(s): LIPASE, AMYLASE in the last 168 hours. No results for input(s): AMMONIA in the last 168 hours. Coagulation Profile: No results for input(s): INR, PROTIME in the last 168 hours. Cardiac Enzymes: No results for input(s): CKTOTAL, CKMB, CKMBINDEX, TROPONINI in the last 168 hours. BNP (last 3 results) No results for input(s): PROBNP in the last 8760 hours. HbA1C: No results for input(s): HGBA1C in the last 72 hours. CBG: Recent Labs  Lab 10/24/21 1857  GLUCAP 92   Lipid Profile: No  results for input(s): CHOL, HDL, LDLCALC, TRIG, CHOLHDL, LDLDIRECT in the last 72 hours. Thyroid Function Tests: No results for input(s): TSH, T4TOTAL, FREET4, T3FREE, THYROIDAB in the last 72 hours. Anemia Panel: No results for input(s): VITAMINB12, FOLATE, FERRITIN, TIBC, IRON, RETICCTPCT in the last 72 hours. Urine analysis:    Component Value Date/Time   COLORURINE YELLOW 05/16/2014 2158   APPEARANCEUR CLEAR 05/16/2014 2158   LABSPEC 1.006 05/16/2014 2158   PHURINE 5.0 05/16/2014 2158   GLUCOSEU NEGATIVE 05/16/2014 2158   HGBUR SMALL (A) 05/16/2014 2158   BILIRUBINUR NEGATIVE 05/16/2014 2158   KETONESUR NEGATIVE 05/16/2014 2158   PROTEINUR NEGATIVE 05/16/2014 2158   UROBILINOGEN 0.2 05/16/2014 2158   NITRITE NEGATIVE 05/16/2014 2158   LEUKOCYTESUR NEGATIVE 05/16/2014 2158    Radiological Exams on Admission: CT Head Wo Contrast  Result Date: 10/24/2021 CLINICAL DATA:  46 year old male status post seizure.  Struck head on bathtub. EXAM: CT HEAD WITHOUT CONTRAST TECHNIQUE: Contiguous axial images were obtained from the base of the skull through the vertex without intravenous contrast. RADIATION DOSE REDUCTION: This exam was performed according to the departmental dose-optimization program which includes automated exposure control, adjustment of the mA and/or kV according to patient size and/or use of iterative reconstruction technique. COMPARISON:  Head CT 08/19/2017 and earlier. FINDINGS: Brain: Cerebral volume remains normal. No midline shift, ventriculomegaly, mass effect, evidence of mass lesion, intracranial hemorrhage or evidence of cortically based acute infarction. Gray-white matter differentiation is within normal limits throughout the brain. Vascular: No suspicious intracranial vascular hyperdensity. Skull: No fracture identified. Sinuses/Orbits: Visualized paranasal sinuses and mastoids are clear. Other: Vertex scalp hematoma tracking to the left (series 3, image 67). Underlying calvarium appears stable and intact. No scalp soft tissue gas. Visualized orbit soft tissues are within normal limits. IMPRESSION: 1. Vertex scalp hematoma without underlying skull fracture. 2. Stable since 2019 and normal noncontrast CT appearance of the brain. Electronically Signed   By: Genevie Ann M.D.   On: 10/24/2021 08:02     EKG: Independently reviewed, with result as described above.    Assessment/Plan   Principal Problem:   Recurrent seizures (HCC) Active Problems:   Hypocalcemia   High anion gap metabolic acidosis   Leukocytosis   Chronic alcohol abuse   Tobacco abuse     #) Generalized tonic-clonic seizures: Reported 4 episodes of generalized tonic-clonic activity over the last 24 hours with associated ensuing postictal states that have resolved.  Episodes lasting from 10 to 20 seconds to a few minutes, with the first 3 episodes spontaneously resolving without pharmacologic intervention, and with the fourth  episode resolving shortly after receiving Ativan 2 mg IV x1.   This is in the context of a documented history of seizures, the patient acknowledging suboptimal compliance with outpatient Keppra 1000 mg p.o. twice daily, noting at least one missed dose of this little antiepileptic medication over the course of the preceding 24 hours.  Consequently, differential for these breakthrough seizures include contribution from suboptimal compliance versus potential for subtherapeutic dose of outpatient Keppra.  Additional potential treat factors include patient's report of increased recent stress from the moving process as well as associated/compensatory decrease in sleep.  He reports daily alcohol consumption, presentation does not appear to be associated with overt evidence of alcohol withdrawal.  will closely monitor for ensuing evidence of such, and also check urinary drug screen.  Does not appear to be on any medications as an outpatient that are associated with lowering of seizure threshold.  In terms of  evaluating for underlying source, pt does not appear to have a history of diabetes, is not on insulin or sulfonylurea as outpatient, and initial labs show no e/o hypoglycemia.  Presenting labs reflect no evidence of hyponatremia.  We will add on serum magnesium level to assess for hypomagnesemia, and refrain from supplementation of his presenting hypocalcemic finding until the serum magnesium level is low so as to not exacerbate potential hypomagnesemia iatrogenically via calcium gluconate.  Renal function appears at baseline.  No overt evidence of underlying infectious process at this time.   No reported preceding or recent trauma, aside from the patient hitting his head as a component of fall experienced during third episode of seizures earlier today, and noncontrast CT head showed no e/o of acute intracranial process, including no e/o of intracranial hemorrhage or acute ischemic infarct. No evidence of residual  acute focal neurologic deficit at this time.   The patient received Keppra loading dose of 1000 mg IV x 2 doses in the ED today.    Neurology has been formally consulted, and Dr. Rory Percy has evaluated the patient upon arrival at [facility], with his recommendations including seizure precautions, EEG in the morning, increasing home dose of Keppra to 1250 mg p.o. twice daily, will also recommend a UDS, UA, chest x-ray, with plan for neurology to continue to follow.     Plan:  Seizure precautions. Prn IV Ativan for breakthrough seizure.  Monitor on telemetry. Serial neuro checks. CMP/CBC in the AM. Check serum Mg level.  Add on serum ethanol level. Check UDS, UA.  Neurology consulted, as above.  Increase home dose of Keppra to 1250 mg p.o. twice daily, per neurology recommendation.  EEG monitoring in AM.  Chest x-ray.  Check ionized calcium level.        #) Hypocalcemia: Presenting labs reflect calcium of 7.9, without need for albumin associated adjustment.  We will confirm the absence of hypomagnesemia prior to supplementing via calcium gluconate in order to reduce risk for iatrogenic hypomagnesia Mia as a consequence of calcium gluconate supplementation, particularly given increased risk for seizures as a consequence of hypomagnesemia.  Plan: Add on serum magnesium level.  Check ionized calcium level, serum phosphorus level.  Repeat CMP in the morning.        #) Anion gap metabolic acidosis: Identified on presenting CMP.  Which appears most likely to be on the basis of the patient's multiple episodes of seizures over the course the last day.  Differential also includes alcoholic lactic acidosis.  Renal function appears normal.  No report of chronic hepatic dysfunction.  No evidence of DKA.  Sepsis felt to be less likely given no clinical evidence to suggest underlying infection.   Plan: Further evaluation management of presenting sutures, as above.  Add on serum ethanol level.  Check  salicylate, UA, UDS,.  INR.  Repeat CMP in the morning.         #) Leukocytosis: Mildly elevated white blood count of 12,200.  In the absence of significant clinical suspicion for underlying infectious process, this elevation is most likely representative of reactive leukocytosis in the setting of patient's presenting multiple seizure-like activities earlier in the day.   Plan: Check urinalysis, chest x-ray.  Further evaluation management of presenting seizures, as above.  Monitor strict I's and O's Daily weights.  Repeat CBC with differential in the morning.        #) Chronic Alcohol Abuse: Daily alcohol consumption, as further quantified above, with reported most recent alcohol consumed  on 10/23/2021.  Elevated history given the patient's presenting seizures, although he does not appear to be physical exam, clinical suggestion, or vital signs consistent with overt alcohol withdrawal.  We will closely monitor for such but will refrain from formal initiation of CIWA in order to reduce risk of masking seizures as a result of potential Ativan received as a consequence of his symptoms based approach.   Plan: counseled the patient on the importance of reduction in alcohol consumption. Consult to transition of care team placed. Close monitoring of ensuing BP and HR via routine VS. Seizure precautions. Telemetry. Add-on serum Mg level. Check serum phosphorus level. Repeat CMP in the morning. Check INR. Add-on serum ethanol level. UDS. Banana bag x1.         #) Chronic tobacco abuse: Patient conveys that they are a current smoker, having smoked half pack per day ppd for greater than 10 years.   Plan: Counseled the patient on the importance of complete smoking discontinuation.  Order placed for prn nicotine patch for use during this hospitalization.     DVT prophylaxis: SCD's   Code Status: Full code Disposition Plan: Per Rounding Team Consults called: ;Dr. Rory Percy Of neurology has been  consulted, as further detailed above  Admission status: Inpatient   PLEASE NOTE THAT DRAGON DICTATION SOFTWARE WAS USED IN THE CONSTRUCTION OF THIS NOTE.   [place] DO [facility]  From [place]   10/24/2021, 8:20 PM

## 2021-10-24 NOTE — Discharge Instructions (Signed)
Your history, exam, and work-up today are consistent with superficial head injury after hitting her head with your seizure and fall.  The imaging showed no evidence of bleeding inside your skull and no skull fracture.  You had the soft tissue hematoma as we discussed.  As your symptoms are likely due to seizure provoked by missing medication, lack of sleep, and stress, we recommend taking her medicines and resting.  We offered other lab work but given your resolution of symptoms and reassuring vital signs we agreed to hold on further labs at this time.  Please follow-up with your neurology team and PCP and please rest.  Please continue your home Keppra.  We loaded you with Keppra this morning.  If any symptoms change or worsen acutely, please return to the nearest emergency department.

## 2021-10-24 NOTE — Assessment & Plan Note (Signed)
 #)   Leukocytosis: Mildly elevated white blood count of 12,200.  In the absence of significant clinical suspicion for underlying infectious process, this elevation is most likely representative of reactive leukocytosis in the setting of patient's presenting multiple seizure-like activities earlier in the day.   Plan: Check urinalysis, chest x-ray.  Further evaluation management of presenting seizures, as above.  Monitor strict I's and O's Daily weights.  Repeat CBC with differential in the morning.

## 2021-10-24 NOTE — ED Triage Notes (Addendum)
Pt has a history of seizure. States he has had 4 since midnight. Pt has a hematoma to the left side of his head. The last seizure that caused the hematoma was unwitnessed. Pt. C/o headache. Pt is non complaint with taking his seizure meds. Last dose was Sat morning per pt. Last drink was yesterday.

## 2021-10-24 NOTE — Progress Notes (Signed)
Plan of Care Note for accepted transfer   Patient: Dalton [name] MRN: [id number]   DOA: 10/24/2021  Facility requesting transfer: [facility] Requesting Provider: [facility] Reason for transfer: Seizures  Facility course: Patient with h/o seizure d/o and noncompliance with medications presenting with seizure activity.  Multiple seizures overnight, planned to discharge but had another seizure in the ER.  Neurology recommends admit for neuro consult, EEG.   Loaded with Keppra.   Plan of care: The patient is accepted for admission to Telemetry unit, at [facility].    Author: Karmen Bongo, MD 10/24/2021  Check [email or web address] for on-call coverage.  Nursing staff, Please call Alpha number on Amion as soon as patient's arrival, so appropriate admitting provider can evaluate the pt.

## 2021-10-24 NOTE — Assessment & Plan Note (Signed)
 #)   Chronic tobacco abuse: Patient conveys that they are a current smoker, having smoked half pack per day ppd for greater than 10 years.   Plan: Counseled the patient on the importance of complete smoking discontinuation.  Order placed for prn nicotine patch for use during this hospitalization.

## 2021-10-24 NOTE — Progress Notes (Signed)
Patient arrived via [facility] to [id number]; admit for seizure; patient is drowsy; cooperative; speech is clear to respond; settled patient into the bed; reviewed fall safety; patient falling asleep during conversation; will remain high fall risk; report given to night RN; bed low locked and alarmed for safety; seizure precautions and equipment in place.  CBG assessed; patient has not eaten all day; only had water per patient; vital signs stable; alerting MD services of arrival.

## 2021-10-24 NOTE — ED Notes (Signed)
Significant other reports she was inside residence when seizure occurred - stated lasted approx 20seconds with post-ictal phase approx 15-87minutes.  Pt reportedly hit head on tub and bit tongue; no bleeding noted on tongue -- hematoma to forehead; no other injuries reported.  Pt denies any urinary incontinence during seizure activity.  States is compliant with seizure meds but did miss Keppra day ago (typically takes Keppra BID)

## 2021-10-24 NOTE — Assessment & Plan Note (Addendum)
Due to LA post-seizure.  Completely resolved with fluids.

## 2021-10-24 NOTE — ED Provider Notes (Addendum)
[facility]   CSN: [id number] Arrival date & time: 10/24/21  0544     History  Chief Complaint  Patient presents with   Seizures    Dalton [name] is a 46 y.o. male.  The history is provided by the patient, medical records and the spouse. No language interpreter was used.  Seizures Seizure activity on arrival: no   Seizure type:  Grand mal Initial focality:  None Episode characteristics: generalized shaking   Postictal symptoms: somnolence   Return to baseline: yes   Severity:  Mild Timing:  Clustered Number of seizures this episode:  3 Progression:  Resolved Context: decreased sleep, medical non-compliance and stress   Context: not alcohol withdrawal and not previous head injury   Recent head injury:  No recent head injuries PTA treatment:  None History of seizures: yes       Home Medications Prior to Admission medications   Medication Sig Start Date End Date Taking? Authorizing Provider  levETIRAcetam (KEPPRA) 1000 MG tablet Take 1 tablet (1,000 mg total) by mouth 2 (two) times daily. 07/28/15   Penumalli, Glenford BayleyVikram R, MD      Allergies    Patient has no known allergies.    Review of Systems   Review of Systems  Constitutional:  Negative for chills, fatigue and fever.  HENT:  Negative for congestion.   Respiratory:  Negative for cough, chest tightness, shortness of breath and wheezing.   Cardiovascular:  Negative for chest pain and palpitations.  Gastrointestinal:  Negative for abdominal pain, constipation, diarrhea, nausea and vomiting.  Genitourinary:  Negative for dysuria.  Musculoskeletal:  Negative for back pain, neck pain and neck stiffness.  Neurological:  Positive for seizures and headaches. Negative for weakness and light-headedness.  Psychiatric/Behavioral:  Negative for agitation and confusion.   All other systems reviewed and are negative.  Physical Exam Updated Vital Signs BP (!) 127/91   Pulse 85    Temp 98.4 F (36.9 C) (Oral)   Resp 18   Ht 5\' 8"  (1.727 m)   Wt 81.6 kg   SpO2 100%   BMI 27.37 kg/m  Physical Exam Vitals and nursing note reviewed.  Constitutional:      General: He is not in acute distress.    Appearance: He is well-developed. He is not ill-appearing, toxic-appearing or diaphoretic.  HENT:     Head:      Nose: No congestion or rhinorrhea.     Mouth/Throat:     Mouth: Mucous membranes are dry.     Pharynx: No oropharyngeal exudate or posterior oropharyngeal erythema.  Eyes:     Extraocular Movements: Extraocular movements intact.     Conjunctiva/sclera: Conjunctivae normal.     Pupils: Pupils are equal, round, and reactive to light.  Neck:     Vascular: No carotid bruit.  Cardiovascular:     Rate and Rhythm: Normal rate and regular rhythm.     Heart sounds: No murmur heard. Pulmonary:     Effort: Pulmonary effort is normal. No respiratory distress.     Breath sounds: Normal breath sounds. No wheezing, rhonchi or rales.  Chest:     Chest wall: No tenderness.  Abdominal:     General: Abdomen is flat.     Palpations: Abdomen is soft.     Tenderness: There is no abdominal tenderness. There is no right CVA tenderness, guarding or rebound.  Musculoskeletal:        General: No swelling.  Cervical back: Neck supple. No tenderness.     Right lower leg: No edema.     Left lower leg: No edema.  Skin:    General: Skin is warm and dry.     Capillary Refill: Capillary refill takes less than 2 seconds.     Findings: No erythema.  Neurological:     General: No focal deficit present.     Mental Status: He is alert and oriented to person, place, and time. Mental status is at baseline.     Sensory: No sensory deficit.     Motor: No weakness.     Coordination: Coordination normal.  Psychiatric:        Mood and Affect: Mood normal.    ED Results / Procedures / Treatments   Labs (all labs ordered are listed, but only abnormal results are displayed) Labs  Reviewed  COMPREHENSIVE METABOLIC PANEL - Abnormal; Notable for the following components:      Result Value   CO2 11 (*)    Glucose, Bld 106 (*)    Calcium 7.9 (*)    Anion gap 17 (*)    All other components within normal limits  CBC WITH DIFFERENTIAL/PLATELET  CBC WITH DIFFERENTIAL/PLATELET  CBC WITH DIFFERENTIAL/PLATELET    EKG None  Radiology CT Head Wo Contrast  Result Date: 10/24/2021 CLINICAL DATA:  46 year old male status post seizure. Struck head on bathtub. EXAM: CT HEAD WITHOUT CONTRAST TECHNIQUE: Contiguous axial images were obtained from the base of the skull through the vertex without intravenous contrast. RADIATION DOSE REDUCTION: This exam was performed according to the departmental dose-optimization program which includes automated exposure control, adjustment of the mA and/or kV according to patient size and/or use of iterative reconstruction technique. COMPARISON:  Head CT 08/19/2017 and earlier. FINDINGS: Brain: Cerebral volume remains normal. No midline shift, ventriculomegaly, mass effect, evidence of mass lesion, intracranial hemorrhage or evidence of cortically based acute infarction. Gray-white matter differentiation is within normal limits throughout the brain. Vascular: No suspicious intracranial vascular hyperdensity. Skull: No fracture identified. Sinuses/Orbits: Visualized paranasal sinuses and mastoids are clear. Other: Vertex scalp hematoma tracking to the left (series 3, image 67). Underlying calvarium appears stable and intact. No scalp soft tissue gas. Visualized orbit soft tissues are within normal limits. IMPRESSION: 1. Vertex scalp hematoma without underlying skull fracture. 2. Stable since 2019 and normal noncontrast CT appearance of the brain. Electronically Signed   By: Odessa Fleming M.D.   On: 10/24/2021 08:02    Procedures Procedures    Medications Ordered in ED Medications  levETIRAcetam (KEPPRA) IVPB 1000 mg/100 mL premix (0 mg Intravenous Stopped  10/24/21 0910)  sodium chloride 0.9 % bolus 1,000 mL (0 mLs Intravenous Stopped 10/24/21 1005)  prochlorperazine (COMPAZINE) injection 10 mg (10 mg Intravenous Given 10/24/21 0741)  diphenhydrAMINE (BENADRYL) injection 50 mg (50 mg Intravenous Given 10/24/21 0741)    ED Course/ Medical Decision Making/ A&P                           Medical Decision Making Amount and/or Complexity of Data Reviewed Labs: ordered. Radiology: ordered.  Risk Prescription drug management. Decision regarding hospitalization.   Dalton [name] is a 46 y.o. male with a past medical history significant for seizures who presents with seizure and head injury.  According to patient and spouse, patient had 3 seizures this morning.  The first 2 seizures lasted several seconds and the third 1 lasted slightly longer.  Patient reportedly  had a seizure and fell and hit his head on the bathroom floor early this morning and had a large bump on his left parietal area of the head.  He is complaining of 7 out of 10 headache.  According to family, patient has had increase in stress as they are moving, has not been sleeping, and does drink some alcohol.  He also says that he did not take his Keppra last night and thinks that provoked a seizure.  He thinks that he has seizures more frequently than every few months but aside from headache he feels normal now.  He denies any previous head injury recently, fevers, chills, neck pain, neck stiffness, nausea, vomiting, vision changes, speech difficulties, or focal neurologic deficits.  He just is feeling the headache and feeling tired.  On exam, lungs clear and chest nontender.  Abdomen nontender.  No focal neurologic deficits.  Normal sensation and strength in extremities.  Normal finger-nose-finger testing.  Clear speech.  Symmetric smile.  Pupils are symmetric and reactive movements.  Neck was nontender with normal range of motion.  Patient has a bump on his left superior parietal/occipital  area.  No laceration seen.  Tongue showed no laceration.  Had a shared decision-making conversation with patient and family.  We agreed to get a CT head to look for intracranial injury and give him a headache cocktail.  We will also load him with some Keppra because he missed last night's dose.  We also discussed getting laboratory testing however as his seizure was likely provoked by missing a dose of medication, lack of sleep, stress, and alcohol, we agreed to hold on labs at this time.  If patient is feeling better, tolerated p.o. challenge, and has reassuring imaging, anticipate discharge home with outpatient follow-up.  CT head showed no acute intracranial injury skull fracture or stroke.  He does have a superficial hematoma which was likely the bump on exam.  No laceration or other injury seen.  On reassessment, patient feels much better.  He has not had further seizures in over 7 hours of observation and his headache has resolved.  He would like to go home.  He will follow-up with outpatient neurology and his PCP.  He will continue taking his seizure medicine and not miss doses and get better sleep.  He agreed with holding on further lab work as his symptoms have resolved.  They understood return precautions and follow-up instructions and patient discharged in good condition.    Final Clinical Impression(s) / ED Diagnoses Final diagnoses:  Seizure (HCC)  Injury of head, initial encounter  Acute nonintractable headache, unspecified headache type    Clinical Impression: 1. Seizure (HCC)   2. Injury of head, initial encounter   3. Acute nonintractable headache, unspecified headache type     Disposition: Discharge  Condition: Good  I have discussed the results, Dx and Tx plan with the pt(& family if present). He/she/they expressed understanding and agree(s) with the plan. Discharge instructions discussed at great length. Strict return precautions discussed and pt &/or family have  verbalized understanding of the instructions. No further questions at time of discharge.    New Prescriptions   No medications on file    Follow Up: your neurology team and PCP     [facility] [address] [place] [address] [phone]       Kyle Stansell, Canary Brim, MD 10/24/21 1312   1:21 PM Just as patient was getting discharge, he had another seizure.  It lasted several minutes.  He did not get hypoxic but it was a tonic-clonic full seizure.  Ativan was given after IV access was secured.  As this is now a fourth seizure, I discussed with family and they agree with getting lab work to look for lab abnormalities and discussion with neurology for management.   1:31 PM Just spoke to Dr. Onalee Hua with neurology who reviewed the case.  He agrees with the management thus far but he agrees with getting labs, giving 1 more gram of Keppra, and after other labs have returned, he agrees with admission to [facility] for EEG and further neurology evaluation given the recurrent seizures.   3:25 PM Metabolic panel did not show significant abnormality with electrolytes or sodium.  Mild hypocalcemia.  CBC still in process however we will call for admission for further management of seizures.   Clinical Impression: 1. Seizure (HCC)   2. Injury of head, initial encounter   3. Acute nonintractable headache, unspecified headache type     Disposition: Admit  This note was prepared with assistance of Dragon voice recognition software. Occasional wrong-word or sound-a-like substitutions may have occurred due to the inherent limitations of voice recognition software.       Theresa Wedel, Canary Brim, MD 10/24/21 1525

## 2021-10-24 NOTE — Consult Note (Signed)
Neurology Consultation  Reason for Consult: Seizures Referring Physician: Dr. Cristal Deer Tegeler  CC: Seizures  History is obtained from: Patient, chart  HPI: Dalton [name] is a 46 y.o. 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  He reportedly had 4 seizures overnight and had a fall from 1 of those as well.  That made him come to the ER to get evaluated. His exam was reassuring and he was nonfocal on exam, back to his baseline, normal preliminary imaging.  He was getting ready to be discharged when he had another seizure.  Labs were obtained which were essentially unremarkable but due to 4-5 seizures in the last 24 hours, ED provider discussed with the on-call neurologist who recommended admission for observation and further neurological consultation. Patient denies any preceding illnesses or sicknesses. Chart lists noncompliance but patient assures compliance. At this time, because he is already received Keppra, there is no point in checking the level. At one point followed with Dr. Marjory Lies at [facility] neurology.  Was on Keppra and Depakote at that time.  Reportedly was not able to afford medications due to lack of insurance at the time. No current outpatient neurologist  ROS: Full ROS was performed and is negative except as noted in the HPI.  Past Medical History:  Diagnosis Date   Noncompliance with medications    Seizures (HCC)    Family History  Problem Relation Age of Onset   Diabetes Mellitus II Mother    Cancer Mother    Cancer Father    Hypertension Father    Social History:   reports that he has been smoking cigarettes. He has a 6.50 pack-year smoking history. He has never used  smokeless tobacco. He reports current alcohol use. He reports that he does not use drugs.  Medications No current facility-administered medications for this encounter.   Exam: Current vital signs: BP (!) 144/97 (BP Location: Left Arm)   Pulse 71   Temp 98.8 F (37.1 C) (Oral)   Resp 20   Ht 5\' 8"  (1.727 m)   Wt 81.6 kg   SpO2 100%   BMI 27.37 kg/m  Vital signs in last 24 hours: Temp:  [98.4 F (36.9 C)-98.8 F (37.1 C)] 98.8 F (37.1 C) (05/21 1854) Pulse Rate:  [58-103] 71 (05/21 1854) Resp:  [12-31] 20 (05/21 1854) BP: (101-144)/(55-97) 144/97 (05/21 1854) SpO2:  [99 %-100 %] 100 % (05/21 1854) Weight:  [81.6 kg] 81.6 kg (05/21 0601) General: Sleeping in bed in no distress HEENT: Normocephalic atraumatic Lungs: Clear Cardiovascular: Regular rhythm Abdomen nondistended nontender Extremities warm well perfused Neurological exam Sleeping in bed.  Awakens to voice.  Appears a little drowsy but able to participate with the exam. Mildly reduced attention concentration No dysarthria Aphasia Cranial nerves II to XII intact Motor examination with no deficits next sensory exam with intact sensation all over Coordination exam with no dysmetria Gait testing deferred NIH stroke scale-0   Labs I have reviewed labs in epic and the results pertinent to this consultation are:  CBC    Component Value Date/Time   WBC 12.2 (H) 10/24/2021 1540   RBC 3.87 (L) 10/24/2021 1540   HGB 11.6 (L) 10/24/2021 1540   HGB 14.2 05/20/2015 1202  HCT 35.1 (L) 10/24/2021 1540   HCT 41.8 05/20/2015 1202   PLT 205 10/24/2021 1540   PLT 276 05/20/2015 1202   MCV 90.7 10/24/2021 1540   MCV 92 05/20/2015 1202   MCH 30.0 10/24/2021 1540   MCHC 33.0 10/24/2021 1540   RDW 13.9 10/24/2021 1540   RDW 14.1 05/20/2015 1202   LYMPHSABS 1.4 10/24/2021 1540   LYMPHSABS 2.4 05/20/2015 1202   MONOABS 1.2 (H) 10/24/2021 1540   EOSABS 0.1 10/24/2021 1540   EOSABS 0.3 05/20/2015 1202   BASOSABS 0.1  10/24/2021 1540   BASOSABS 0.0 05/20/2015 1202    CMP     Component Value Date/Time   NA 135 10/24/2021 1323   NA 139 05/20/2015 1202   K 4.8 10/24/2021 1323   CL 107 10/24/2021 1323   CO2 11 (L) 10/24/2021 1323   GLUCOSE 106 (H) 10/24/2021 1323   BUN 14 10/24/2021 1323   BUN 8 05/20/2015 1202   CREATININE 1.05 10/24/2021 1323   CALCIUM 7.9 (L) 10/24/2021 1323   PROT 6.7 10/24/2021 1323   PROT 7.3 05/20/2015 1202   ALBUMIN 3.8 10/24/2021 1323   ALBUMIN 4.5 05/20/2015 1202   AST 28 10/24/2021 1323   ALT 11 10/24/2021 1323   ALKPHOS 60 10/24/2021 1323   BILITOT 0.5 10/24/2021 1323   BILITOT 0.3 05/20/2015 1202   GFRNONAA >60 10/24/2021 1323   GFRAA >60 08/19/2017 1938    Imaging I have reviewed the images obtained:  CT-head-vertex hematoma without acute intracerebral findings.  Assessment: 46 year old with a history of generalized tonic-clonic seizures with questionable compliance to medications presenting for multiple seizures in the last 24 hours. Was seen in the ED, assuring exam and imaging as well as labs and was about to be discharged when he had another seizure-for that reason admitted for observation and neurological consultation. Does not have an outpatient neurologist at this time. On my examination-other than being just a little bit drowsy, he is nonfocal.  Likely breakthrough seizures due to either noncompliance or low dose of antiepileptics. Last reviewed-dose of Keppra was 1000 twice daily.   Recommendations: Admit for observation Maintain seizure precautions EEG in the morning Already received Keppra load's x2 Increase Keppra dose to 1250 mg twice daily. Check urinalysis, UDS, chest x-ray Neurology to follow  Plan relayed to the admitting MD via secure chat  -- Milon Dikes, MD Neurologist [facility] Pager: [phone]

## 2021-10-25 ENCOUNTER — Inpatient Hospital Stay (HOSPITAL_COMMUNITY): Payer: Self-pay

## 2021-10-25 ENCOUNTER — Other Ambulatory Visit (HOSPITAL_COMMUNITY): Payer: Self-pay

## 2021-10-25 LAB — COMPREHENSIVE METABOLIC PANEL
ALT: 13 U/L (ref 0–44)
AST: 18 U/L (ref 15–41)
Albumin: 3.2 g/dL — ABNORMAL LOW (ref 3.5–5.0)
Alkaline Phosphatase: 69 U/L (ref 38–126)
Anion gap: 5 (ref 5–15)
BUN: 10 mg/dL (ref 6–20)
CO2: 23 mmol/L (ref 22–32)
Calcium: 8.4 mg/dL — ABNORMAL LOW (ref 8.9–10.3)
Chloride: 110 mmol/L (ref 98–111)
Creatinine, Ser: 0.94 mg/dL (ref 0.61–1.24)
GFR, Estimated: >60 mL/min (ref >60)
Glucose, Bld: 98 mg/dL (ref 70–99)
Potassium: 3.5 mmol/L (ref 3.5–5.1)
Sodium: 138 mmol/L (ref 135–145)
Total Bilirubin: 0.5 mg/dL (ref 0.3–1.2)
Total Protein: 6.1 g/dL — ABNORMAL LOW (ref 6.5–8.1)

## 2021-10-25 LAB — CBC WITH DIFFERENTIAL/PLATELET
Abs Immature Granulocytes: 0.04 10*3/uL (ref 0.00–0.07)
Basophils Absolute: 0.1 10*3/uL (ref 0.0–0.1)
Basophils Relative: 1 %
Eosinophils Absolute: 0.2 10*3/uL (ref 0.0–0.5)
Eosinophils Relative: 2 %
HCT: 34.5 % — ABNORMAL LOW (ref 39.0–52.0)
Hemoglobin: 11.6 g/dL — ABNORMAL LOW (ref 13.0–17.0)
Immature Granulocytes: 0 %
Lymphocytes Relative: 23 %
Lymphs Abs: 3 10*3/uL (ref 0.7–4.0)
MCH: 30.9 pg (ref 26.0–34.0)
MCHC: 33.6 g/dL (ref 30.0–36.0)
MCV: 91.8 fL (ref 80.0–100.0)
Monocytes Absolute: 1.4 10*3/uL — ABNORMAL HIGH (ref 0.1–1.0)
Monocytes Relative: 11 %
Neutro Abs: 8.3 10*3/uL — ABNORMAL HIGH (ref 1.7–7.7)
Neutrophils Relative %: 63 %
Platelets: 230 10*3/uL (ref 150–400)
RBC: 3.76 MIL/uL — ABNORMAL LOW (ref 4.22–5.81)
RDW: 13.7 % (ref 11.5–15.5)
WBC: 13 10*3/uL — ABNORMAL HIGH (ref 4.0–10.5)
nRBC: 0 % (ref 0.0–0.2)

## 2021-10-25 LAB — URINALYSIS, COMPLETE (UACMP) WITH MICROSCOPIC
Bilirubin Urine: NEGATIVE
Glucose, UA: NEGATIVE mg/dL
Ketones, ur: NEGATIVE mg/dL
Leukocytes,Ua: NEGATIVE
Nitrite: POSITIVE — AB
Protein, ur: NEGATIVE mg/dL
Specific Gravity, Urine: >1.03 — ABNORMAL HIGH (ref 1.005–1.030)
pH: 6 (ref 5.0–8.0)

## 2021-10-25 LAB — SALICYLATE LEVEL: Salicylate Lvl: <7 mg/dL — ABNORMAL LOW (ref 7.0–30.0)

## 2021-10-25 LAB — PROTIME-INR
INR: 1 (ref 0.8–1.2)
Prothrombin Time: 13.1 seconds (ref 11.4–15.2)

## 2021-10-25 LAB — MAGNESIUM: Magnesium: 2 mg/dL (ref 1.7–2.4)

## 2021-10-25 LAB — PHOSPHORUS: Phosphorus: 3 mg/dL (ref 2.5–4.6)

## 2021-10-25 LAB — ETHANOL: Alcohol, Ethyl (B): <10 mg/dL (ref <10)

## 2021-10-25 MED ORDER — LEVETIRACETAM ER 500 MG PO TB24
1500.0000 mg | ORAL_TABLET | Freq: Every day | ORAL | 11 refills | Status: AC
  Filled 2021-10-25: qty 90, 30d supply, fill #0

## 2021-10-25 MED ORDER — B-6 100 MG PO TABS: 200.0000 mg | ORAL_TABLET | Freq: Every day | ORAL | Status: DC

## 2021-10-25 MED ORDER — LEVETIRACETAM ER 500 MG PO TB24
1000.0000 mg | ORAL_TABLET | Freq: Every day | ORAL | 11 refills | Status: DC
  Filled 2021-10-25: qty 60, 30d supply, fill #0

## 2021-10-25 MED ORDER — LEVETIRACETAM ER 500 MG PO TB24
1000.0000 mg | ORAL_TABLET | Freq: Every day | ORAL | Status: DC
Start: 2021-10-25 — End: 2021-10-25

## 2021-10-25 MED ORDER — LEVETIRACETAM ER 500 MG PO TB24
1500.0000 mg | ORAL_TABLET | Freq: Every day | ORAL | Status: DC
  Administered 2021-10-25: 1500 mg via ORAL
  Filled 2021-10-25: qty 3

## 2021-10-25 MED ORDER — VITAMIN B-6 100 MG PO TABS
200.0000 mg | ORAL_TABLET | Freq: Every day | ORAL | Status: DC
  Administered 2021-10-25: 200 mg via ORAL
  Filled 2021-10-25: qty 2

## 2021-10-25 MED ORDER — LEVETIRACETAM ER 500 MG PO TB24
1500.0000 mg | ORAL_TABLET | Freq: Every day | ORAL | 11 refills | Status: DC
  Filled 2021-10-25: qty 90, 30d supply, fill #0

## 2021-10-25 NOTE — Discharge Summary (Signed)
Physician Discharge Summary   Patient: Dalton [name] MRN: [id number] DOB: April 30, 1976  Admit date:     10/24/2021  Discharge date: 10/25/21  Discharge Physician: Alberteen Sam   PCP: Patient, No Pcp Per (Inactive)     Recommendations at discharge:  Follow up with new Neurologist as soon as able     Discharge Diagnoses: Principal Problem:   Recurrent seizures (HCC) Active Problems:   Hypocalcemia   High anion gap metabolic acidosis   Chronic alcohol abuse   Tobacco abuse      Hospital Course: Mr. Kernodle is a 47 y.o. M with hx seizures, no regular PCP or Neurology follow up who presented with recurrent seizures.      * Recurrent seizures (HCC) Patient experienced 4 seizures on day of admission, followed by 5th in the ER.  In the setting of decreased sleep, increased EtOH and increased life stress of impending move, as well as at least one missed dose of medication and no regular Neurology follow up.  Here, Neurology evaluated patient, recommended EEG, increase in Keppra.  UA, CXR unremarkable.  UDS showed benzos, which patient was given in the ER.  EEG obtained, showed cortical dysfunction in left temporal region.  After observation overnight, the patient had no further seizures.  Neurology recommended discharge on Keppra XR 1500 mg daily, dose confirmed twice with Dr. Marisue Humble.  Prescription sent to [facility] pharmacy, patient discharged with pills on hand, he has a referral to care coordination agency in [place].                 The [facility] Controlled Substances Registry was reviewed for this patient prior to discharge.   Consultants: Neurology Procedures performed:  EEG   Disposition: Home Diet recommendation:  Discharge Diet Orders (From admission, onward)     Start     Ordered   10/25/21 0000  Diet - low sodium heart healthy        10/25/21 1538             DISCHARGE MEDICATION: Allergies as of 10/25/2021   No  Known Allergies      Medication List     STOP taking these medications    levETIRAcetam 1000 MG tablet Commonly known as: KEPPRA Replaced by: levETIRAcetam 500 MG 24 hr tablet       TAKE these medications    B-6 100 MG Tabs Take 200 mg by mouth daily.   levETIRAcetam 500 MG 24 hr tablet Commonly known as: Keppra XR Take 3 tablets (1,500 mg total) by mouth daily. Replaces: levETIRAcetam 1000 MG tablet         Discharge Instructions     Ambulatory referral to Neurology   Complete by: As directed    An appointment is requested in approximately: 8 weeks For seizure   Diet - low sodium heart healthy   Complete by: As directed    Discharge instructions   Complete by: As directed    You were admitted for a seizure. This was likely related to missing doses of your medicines Lack of sleep and alcohol also likely contributed. You may not drive for 6 months under [employer] should increase your dose of Keppra Take long-acting levetiracetam/Keppra ER 1500 mg once daily  (ER stands for "extended release") Take this with vitamin B6 100 or 200 mg daily (B6 is also called "pyridoxine") to reduce irritability with Keppra We have sent a referral to a local neurologist for you to get  an appointment   Increase activity slowly   Complete by: As directed        Discharge Exam: Filed Weights   10/24/21 0601 10/25/21 0500  Weight: 81.6 kg 84.5 kg    General: Pt is alert, awake, not in acute distress Cardiovascular: RRR, nl S1-S2, no murmurs appreciated.   No LE edema.   Respiratory: Normal respiratory rate and rhythm.  CTAB without rales or wheezes. Abdominal: Abdomen soft and non-tender.  No distension or HSM.   Neuro/Psych: Strength symmetric in upper and lower extremities.  Judgment and insight appear normal.   Condition at discharge: good  The results of significant diagnostics from this hospitalization (including imaging, microbiology, ancillary and  laboratory) are listed below for reference.   Imaging Studies: CT Head Wo Contrast  Result Date: 10/24/2021 CLINICAL DATA:  46 year old male status post seizure. Struck head on bathtub. EXAM: CT HEAD WITHOUT CONTRAST TECHNIQUE: Contiguous axial images were obtained from the base of the skull through the vertex without intravenous contrast. RADIATION DOSE REDUCTION: This exam was performed according to the departmental dose-optimization program which includes automated exposure control, adjustment of the mA and/or kV according to patient size and/or use of iterative reconstruction technique. COMPARISON:  Head CT 08/19/2017 and earlier. FINDINGS: Brain: Cerebral volume remains normal. No midline shift, ventriculomegaly, mass effect, evidence of mass lesion, intracranial hemorrhage or evidence of cortically based acute infarction. Gray-white matter differentiation is within normal limits throughout the brain. Vascular: No suspicious intracranial vascular hyperdensity. Skull: No fracture identified. Sinuses/Orbits: Visualized paranasal sinuses and mastoids are clear. Other: Vertex scalp hematoma tracking to the left (series 3, image 67). Underlying calvarium appears stable and intact. No scalp soft tissue gas. Visualized orbit soft tissues are within normal limits. IMPRESSION: 1. Vertex scalp hematoma without underlying skull fracture. 2. Stable since 2019 and normal noncontrast CT appearance of the brain. Electronically Signed   By: Odessa Fleming M.D.   On: 10/24/2021 08:02   DG Chest Port 1 View  Result Date: 10/24/2021 CLINICAL DATA:  Seizure.  Mild CT chest 05/16/2014 EXAM: PORTABLE CHEST 1 VIEW COMPARISON:  None Available. FINDINGS: The heart size and mediastinal contours are within normal limits. Both lungs are clear. The visualized skeletal structures are unremarkable. IMPRESSION: No active disease. Electronically Signed   By: Darliss Cheney M.D.   On: 10/24/2021 22:25   EEG adult  Result Date:  10/25/2021 Charlsie Quest, MD     10/25/2021  4:25 PM Patient Name: Dalton [name] MRN: [id number] Epilepsy Attending: Charlsie Quest Referring Physician/Provider: Milon Dikes, MD Date: 5/22/202213 Duration: 21.36 mins Patient history: 46 year old with a history of generalized tonic-clonic seizures with questionable compliance to medications presenting for multiple seizures in the last 24 hours. EEG to evaluate for seizure Level of alertness: Awake, asleep AEDs during EEG study: LEV Technical aspects: This EEG study was done with scalp electrodes positioned according to the 10-20 International system of electrode placement. Electrical activity was acquired at a sampling rate of 500Hz  and reviewed with a high frequency filter of 70Hz  and a low frequency filter of 1Hz . EEG data were recorded continuously and digitally stored. Description: The posterior dominant rhythm consists of 9 Hz activity of moderate voltage (25-35 uV) seen predominantly in posterior head regions, symmetric and reactive to eye opening and eye closing. Sleep was characterized by vertex waves, sleep spindles (12 to 14 Hz), maximal frontocentral region. EEG showed intermittent 2-3hz  delta slowing in left temporal region. Hyperventilation and photic stimulation were not performed.  ABNORMALITY - Intermittent slow, left temporal region. IMPRESSION: This study is suggestive of cortical dysfunction arising from left temporal region, nonspecific etiology. No seizures or epileptiform discharges were seen throughout the recording. Charlsie QuestPriyanka O Yadav    Microbiology: No results found for this or any previous visit.  Labs: CBC: Recent Labs  Lab 10/24/21 1540 10/25/21 0046  WBC 12.2* 13.0*  NEUTROABS 9.5* 8.3*  HGB 11.6* 11.6*  HCT 35.1* 34.5*  MCV 90.7 91.8  PLT 205 230   Basic Metabolic Panel: Recent Labs  Lab 10/24/21 1323 10/24/21 2022 10/25/21 0046  NA 135  --  138  K 4.8  --  3.5  CL 107  --  110  CO2 11*  --  23   GLUCOSE 106*  --  98  BUN 14  --  10  CREATININE 1.05  --  0.94  CALCIUM 7.9*  --  8.4*  MG  --  2.1 2.0  PHOS  --   --  3.0   Liver Function Tests: Recent Labs  Lab 10/24/21 1323 10/25/21 0046  AST 28 18  ALT 11 13  ALKPHOS 60 69  BILITOT 0.5 0.5  PROT 6.7 6.1*  ALBUMIN 3.8 3.2*   CBG: Recent Labs  Lab 10/24/21 1857  GLUCAP 92    Discharge time spent: approximately 45 minutes spent on discharge counseling, evaluation of patient on day of discharge, and coordination of discharge planning with nursing, social work, pharmacy and case management  Signed: Alberteen Samhristopher P Sharni Negron, MD [facility] 10/25/2021

## 2021-10-25 NOTE — TOC Transition Note (Signed)
Transition of Care [facility]) - CM/SW Discharge Note   Patient Details  Name: Dalton [name] MRN: [id number] Date of Birth: Nov 18, 1975  Transition of Care [facility]) CM/SW Contact:  Kermit Balo, RN Phone Number: 10/25/2021, 4:13 PM   Clinical Narrative:    Pt states he has been going to the [facility] in [facility] for his meds and PCP. CM called the pharmacy at the HD and they say he hasnt been in a while so no longer enrolled in the program.  The [facility] in [facility] also asked about he long acting Keppra that MD wants to order. They state they dont carry Keppra. CM called [facility] in [facility] and they can see the patient and will get him set up with a clinic and will have him entered for medication assistance. He will need to go by the [facility] this week. Pt is aware.  Pt denies issues with transportation.  Medications for today to be provided from [facility] pharmacy under the [facility] program.  Family at bedside to transport home.  Final next level of care: Home/Self Care Barriers to Discharge: Inadequate or no insurance, Barriers Unresolved (comment)   Patient Goals and CMS Choice        Discharge Placement                       Discharge Plan and Services                                     Social Determinants of Health (SDOH) Interventions     Readmission Risk Interventions     View : No data to display.

## 2021-10-25 NOTE — Plan of Care (Signed)

## 2021-10-25 NOTE — Progress Notes (Signed)
Neurology Progress Note  Brief HPI: Dalton [name] is a 46 y.o. male PMH of generalized tonic-clonic seizures since the age of 46 years old. ER for evaluation of multiple seizures.  States he is frequently missing his medication, multiple times a month Originally followed with Dr. Marjory Lies at [facility] neurology.  No current outpatient neurologist, no current PCP, gets medications from the health department  Subjective: Seen in room with girlfriend at the bedside. EEG just completed.   Exam: Vitals:   10/25/21 0444 10/25/21 0747  BP: 115/80 136/78  Pulse: 65 65  Resp: 17 20  Temp: 98.3 F (36.8 C) 99 F (37.2 C)  SpO2: 100% 100%   Gen: In bed, NAD Resp: non-labored breathing, no acute distress Abd: soft, nt   Physical Exam  Constitutional: Appears well-developed and well-nourished.  Psych: Affect appropriate to situation Eyes: No scleral injection HENT: No OP obstrucion MSK: no joint deformities.  Cardiovascular: Normal rate and regular rhythm.  Respiratory: Effort normal, non-labored breathing GI: Soft.  No distension. There is no tenderness.  Skin: WDI  Neuro: Mental Status: Patient is awake, alert, oriented x4 Patient is able to give a clear and coherent history. No signs of aphasia or neglect Cranial Nerves: II: Visual Fields are full. Pupils are equal, round, and reactive to light.   III,IV, VI: EOMI without ptosis or diploplia.  V: Facial sensation is symmetric to temperature VII: Facial movement is symmetric resting and smiling VIII: Hearing is intact to voice X: Palate elevates symmetrically XI: Shoulder shrug is symmetric. XII: Tongue protrudes midline without atrophy or fasciculations.  Motor: Tone is normal. Bulk is normal. 5/5 strength was present in all four extremities.  Sensory: Sensation is symmetric to light touch and temperature Cerebellar:  No ataxia noted   Pertinent Labs: WBC 13.0 Na - 3.8 K- 3.5 Ca - 8.4   Imaging Reviewed: EEG:    Assessment: 46 year old with a history of generalized tonic-clonic seizures with questionable compliance to medications presenting for multiple seizures in the last 24 hours.    Recommendations: - Continue keppra, enforce compliance - Keppra switched to ER to help with compliance (may go back to 1250 BID if unable to get extended release outpatient) - recommend 200mg  vitamin B6 daily to help with keppra side effects. - Follow up with neurology outpatient - Need to establish with PCP    Patient seen and examined by NP/APP with MD. MD to update note as needed.   , DNP, FNP-BC [facility] Pager: [phone]

## 2021-10-25 NOTE — Progress Notes (Signed)
Discharged to home after instructions provided and Ivs removed.  All questions answered.  Meds were brought to the room to go with him.Marland Kitchen

## 2021-10-25 NOTE — TOC CAGE-AID Note (Signed)
Transition of Care [facility]) - CAGE-AID Screening   Patient Details  Name: Dalton [name] MRN: [id number] Date of Birth: 12/15/1975  Transition of Care [facility]) CM/SW Contact:    Pollie Friar, RN Phone Number: 10/25/2021, 2:40 PM   Clinical Narrative: At this time patient doesn't feel he needs the resources for inpatient/ outpatient alcohol counseling.   CAGE-AID Screening:    Have You Ever Felt You Ought to Cut Down on Your Drinking or Drug Use?: Yes Have People Annoyed You By Critizing Your Drinking Or Drug Use?: No Have You Felt Bad Or Guilty About Your Drinking Or Drug Use?: No Have You Ever Had a Drink or Used Drugs First Thing In The Morning to Steady Your Nerves or to Get Rid of a Hangover?: Yes CAGE-AID Score: 2  Substance Abuse Education Offered: Yes (pt refused)

## 2021-10-25 NOTE — Procedures (Signed)
Patient Name: Dalton [name]  MRN: [id number]  Epilepsy Attending: Charlsie Quest  Referring Physician/Provider: Milon Dikes, MD Date: 5/22/202213 Duration: 21.36 mins  Patient history: 47 year old with a history of generalized tonic-clonic seizures with questionable compliance to medications presenting for multiple seizures in the last 24 hours. EEG to evaluate for seizure  Level of alertness: Awake, asleep  AEDs during EEG study: LEV  Technical aspects: This EEG study was done with scalp electrodes positioned according to the 10-20 International system of electrode placement. Electrical activity was acquired at a sampling rate of 500Hz  and reviewed with a high frequency filter of 70Hz  and a low frequency filter of 1Hz . EEG data were recorded continuously and digitally stored.   Description: The posterior dominant rhythm consists of 9 Hz activity of moderate voltage (25-35 uV) seen predominantly in posterior head regions, symmetric and reactive to eye opening and eye closing. Sleep was characterized by vertex waves, sleep spindles (12 to 14 Hz), maximal frontocentral region. EEG showed intermittent 2-3hz  delta slowing in left temporal region. Hyperventilation and photic stimulation were not performed.     ABNORMALITY - Intermittent slow, left temporal region.  IMPRESSION: This study is suggestive of cortical dysfunction arising from left temporal region, nonspecific etiology. No seizures or epileptiform discharges were seen throughout the recording.  Sherrel Ploch 

## 2021-10-25 NOTE — Progress Notes (Signed)
EEG complete - results pending 

## 2021-10-25 NOTE — Hospital Course (Signed)
Dalton [name] is a 46 y.o. M with hx seizures, no regular PCP or Neurology follow up who presented with recurrent seizures.

## 2021-10-26 LAB — CALCIUM, IONIZED: Calcium, Ionized, Serum: 5.1 mg/dL (ref 4.5–5.6)

## 2021-10-27 ENCOUNTER — Telehealth: Payer: Self-pay

## 2021-10-27 NOTE — Telephone Encounter (Signed)
Spoke with Transitions of Care (CM- Harlin Rain) Dalton [name] regarding referral for patient to assist him with the enrollment process into [facility] [place] and to connect with a primary care provided in order to receive continued medical care for current conditions.  Pt was stated to be discharged on 5.22.23 and is homeless within [facility] area.     Plan -Case manager was advised pt would be followup to assist with enrollment into [facility] program.

## 2021-10-27 NOTE — Telephone Encounter (Signed)
Follow up on Referral from Transitions of Care (CM- Harlin Rain) Dalton [name] to assist with the enrollment process into [facility] [place] and to connect with a primary care provided in order to receive continued medical care for current conditons.  pt was unavailable at time of call, voice mail was full, but a text message was left for him to return call.  Will make a another attempt on 5.25.25

## 2021-10-27 NOTE — Telephone Encounter (Signed)
Attempted to follow up on Referral from Transitions of Care (CM- Maryellen Pile) [facility] to assist with the enrollment process into [facility] [place] and to connect with a primary care provided in order to receive continued medical care for current conditons   pt was unavailable at time of call, voice mail was full, but a text message was left for him to return call.  Will make a another attempt on 5.25.25

## 2021-11-10 ENCOUNTER — Telehealth (HOSPITAL_BASED_OUTPATIENT_CLINIC_OR_DEPARTMENT_OTHER): Payer: Self-pay

## 2021-11-10 ENCOUNTER — Other Ambulatory Visit (HOSPITAL_BASED_OUTPATIENT_CLINIC_OR_DEPARTMENT_OTHER): Payer: Self-pay

## 2021-11-10 NOTE — Telephone Encounter (Signed)
Transitions of Care Pharmacy   Call attempted for a pharmacy transitions of care follow-up. VM box was full.  Call attempt #1. Will follow-up in 2-3 days.    Jiles Crocker, PharmD Clinical Pharmacist [facility] 11/10/2021 3:52 PM

## 2021-11-17 ENCOUNTER — Other Ambulatory Visit (HOSPITAL_COMMUNITY): Payer: Self-pay

## 2021-11-17 ENCOUNTER — Telehealth (HOSPITAL_COMMUNITY): Payer: Self-pay

## 2021-11-17 NOTE — Telephone Encounter (Signed)
Transitions of Care Pharmacy   Call attempted for a pharmacy transitions of care follow-up.   Call attempt #2. Will follow-up in 2-3 days.   Jiles Crocker, PharmD Clinical Pharmacist Med [facility] 11/17/2021 10:20 AM

## 2021-11-18 ENCOUNTER — Telehealth (HOSPITAL_COMMUNITY): Payer: Self-pay

## 2021-11-18 NOTE — Telephone Encounter (Signed)
Transitions of Care Pharmacy   Call attempted for a pharmacy transitions of care follow-up. Unable to leave voicemail.  Call attempt #3.

## 2022-01-31 ENCOUNTER — Other Ambulatory Visit: Payer: Self-pay | Admitting: *Deleted

## 2022-01-31 DIAGNOSIS — K4090 Unilateral inguinal hernia, without obstruction or gangrene, not specified as recurrent: Secondary | ICD-10-CM

## 2022-02-03 ENCOUNTER — Ambulatory Visit: Payer: Self-pay | Admitting: Surgery

## 2022-02-22 ENCOUNTER — Encounter: Payer: Self-pay | Admitting: Surgery

## 2022-02-22 ENCOUNTER — Ambulatory Visit: Payer: Self-pay | Admitting: Surgery

## 2022-02-22 ENCOUNTER — Encounter: Payer: Self-pay | Admitting: Family Medicine

## 2022-02-22 VITALS — BP 127/89 | HR 62 | Temp 98.7°F | Resp 12 | Ht 68.0 in | Wt 182.0 lb

## 2022-02-22 DIAGNOSIS — K4090 Unilateral inguinal hernia, without obstruction or gangrene, not specified as recurrent: Secondary | ICD-10-CM

## 2022-02-23 NOTE — Progress Notes (Signed)
Dalton [name]  Reason for Referral: Left inguinal hernia Referring Physician: Sofie Rower, PA-C  Chief Complaint   New Patient (Initial Visit)     Dalton [name] is a 46 y.o. male.  HPI: Patient presents for evaluation of a left inguinal hernia.  It has been present for a little bit under a year.  He does confirm some pain associated with the hernia recently.  He has noted a bulge that he has been unable to reduce for the last 2 to 3 months.  He is tolerating a diet without nausea and vomiting, moving his bowels without issue.  His last bowel movement was this morning.  He denies any history of any kind of abdominal surgeries.  His past medical history is significant for seizures.  He denies use of blood thinning medications.  He smokes 1/2 pack/day and usually drinks 1-2 beers per day, though he has not done this recently.  He denies use of illicit drugs.  Past Medical History:  Diagnosis Date   Noncompliance with medications    Seizures ([place])     Past Surgical History:  Procedure Laterality Date   CLOSED REDUCTION MANDIBLE WITH MANDIBULOMA N/A 05/17/2014   Procedure: CLOSED REDUCTION MANDIBLE WITH MANDIBULOMAXILLARY FUSION (MMF SCREWS)  REPAIR OF LOWER LIP LACERATION;  Surgeon: Ascencion Dike, MD;  Location: [place];  Service: ENT;  Laterality: N/A;   FIBULA FRACTURE SURGERY     MANDIBULAR HARDWARE REMOVAL Bilateral 06/30/2014   Procedure: MANDIBULAR/MAXILLARY HARDWARE REMOVAL;  Surgeon: Ascencion Dike, MD;  Location: [place];  Service: ENT;  Laterality: Bilateral;   WOUND EXPLORATION Left 05/17/2014   Procedure: LEFT HAND  HAND WOUND EXPLORATION REPAIR AS INDIDATED;  Surgeon: Ascencion Dike, MD;  Location: [place];  Service: ENT;  Laterality: Left;   WOUND EXPLORATION Left 05/17/2014   Procedure: LEFT HAND WOUND EXPLORATION REPAIR WITH TENDON AND MUSCLE REPAIR ;  Surgeon: Linna Hoff, MD;  Location: [place];  Service: Orthopedics;   Laterality: Left;    Family History  Problem Relation Age of Onset   Diabetes Mellitus II Mother    Cancer Mother    Cancer Father    Hypertension Father     Social History   Tobacco Use   Smoking status: Every Day    Packs/day: 0.50    Years: 13.00    Total pack years: 6.50    Types: Cigarettes   Smokeless tobacco: Never  Substance Use Topics   Alcohol use: Yes    Comment: daily beers   Drug use: No    Comment: Quit 06/2004    Medications: I have reviewed the patient's current medications. Allergies as of 02/22/2022   No Known Allergies      Medication List        Accurate as of February 22, 2022 11:59 PM. If you have any questions, ask your nurse or doctor.          STOP taking these medications    B-6 100 MG Tabs Stopped by: Dalton Ulibarri A Erykah Lippert, DO       TAKE these medications    levETIRAcetam 500 MG 24 hr tablet Commonly known as: Keppra XR Take 3 tablets (1,500 mg total) by mouth daily.         ROS:  Constitutional: negative for chills, fatigue, and fevers Eyes: negative for visual disturbance and pain Ears, nose, mouth, throat, and face: negative for ear drainage, sore throat, and sinus problems Respiratory:  negative for cough, wheezing, and shortness of breath Cardiovascular: negative for chest pain Gastrointestinal: negative for abdominal pain, nausea, reflux symptoms, and vomiting Genitourinary:negative for dysuria, frequency, and urinary retention Integument/breast: negative for dryness and rash Hematologic/lymphatic: negative for bleeding and lymphadenopathy Musculoskeletal:negative for back pain, neck pain, and joint pain Behavioral/Psych: negative  Blood pressure 127/89, pulse 62, temperature 98.7 F (37.1 C), temperature source Oral, resp. rate 12, height 5' 8" (1.727 m), weight 182 lb (82.6 kg), SpO2 99 %. [name] Exam Vitals reviewed.  Constitutional:      Appearance: Normal appearance.  HENT:     Head: Normocephalic  and atraumatic.  Eyes:     Extraocular Movements: Extraocular movements intact.     Pupils: Pupils are equal, round, and reactive to light.  Cardiovascular:     Rate and Rhythm: Normal rate and regular rhythm.  Pulmonary:     Effort: Pulmonary effort is normal.     Breath sounds: Normal breath sounds.  Abdominal:     Comments: Abdomen soft, nondistended, no percussion tenderness, nontender to palpation; no rigidity, guarding, or rebound tenderness; soft and reducible left inguinal hernia  Musculoskeletal:        General: Normal range of motion.     Cervical back: Normal range of motion.  Skin:    General: Skin is warm and dry.  Neurological:     General: No focal deficit present.     Mental Status: He is alert and oriented to person, place, and time.  Psychiatric:        Mood and Affect: Mood normal.        Behavior: Behavior normal.     Results: No results found for this or any previous visit (from the past 48 hour(s)).  No results found.   Assessment & Plan:  Dalton [name] is a 46 y.o. male who presents for evaluation of left inguinal hernia.  -I explained the pathophysiology of hernias, and why we recommend surgical repair -The risk and benefits of open left inguinal hernia repair with mesh were discussed including but not limited to bleeding, infection, injury to surrounding structures, hernia recurrence, and need for additional procedure.  After careful consideration, Dalton [name] has decided to proceed with surgical intervention.  -Information provided to the patient regarding getting financial assistance for coverage of the cost of surgery -Patient tentatively scheduled for surgery on October 18 -Advised the patient to present to the ED if he begins to have fever, chills, worsening groin pain, nonreducible bulge, nausea, vomiting, and obstipation  All questions were answered to the satisfaction of the patient and family.  Shamara Soza, DO [facility] [address] [place], [place] [address] [phone] (office) 

## 2022-02-23 NOTE — H&P (Signed)
[facility] History and Physical  Reason for Referral: Left inguinal hernia Referring Physician: Sofie Rower, PA-C  Chief Complaint   New Patient (Initial Visit)     Dalton [name] is a 46 y.o. male.  HPI: Patient presents for evaluation of a left inguinal hernia.  It has been present for a little bit under a year.  He does confirm some pain associated with the hernia recently.  He has noted a bulge that he has been unable to reduce for the last 2 to 3 months.  He is tolerating a diet without nausea and vomiting, moving his bowels without issue.  His last bowel movement was this morning.  He denies any history of any kind of abdominal surgeries.  His past medical history is significant for seizures.  He denies use of blood thinning medications.  He smokes 1/2 pack/day and usually drinks 1-2 beers per day, though he has not done this recently.  He denies use of illicit drugs.  Past Medical History:  Diagnosis Date   Noncompliance with medications    Seizures ([place])     Past Surgical History:  Procedure Laterality Date   CLOSED REDUCTION MANDIBLE WITH MANDIBULOMA N/A 05/17/2014   Procedure: CLOSED REDUCTION MANDIBLE WITH MANDIBULOMAXILLARY FUSION (MMF SCREWS)  REPAIR OF LOWER LIP LACERATION;  Surgeon: Ascencion Dike, MD;  Location: [place];  Service: ENT;  Laterality: N/A;   FIBULA FRACTURE SURGERY     MANDIBULAR HARDWARE REMOVAL Bilateral 06/30/2014   Procedure: MANDIBULAR/MAXILLARY HARDWARE REMOVAL;  Surgeon: Ascencion Dike, MD;  Location: [place];  Service: ENT;  Laterality: Bilateral;   WOUND EXPLORATION Left 05/17/2014   Procedure: LEFT HAND  HAND WOUND EXPLORATION REPAIR AS INDIDATED;  Surgeon: Ascencion Dike, MD;  Location: [place];  Service: ENT;  Laterality: Left;   WOUND EXPLORATION Left 05/17/2014   Procedure: LEFT HAND WOUND EXPLORATION REPAIR WITH TENDON AND MUSCLE REPAIR ;  Surgeon: Linna Hoff, MD;  Location: [place];  Service: Orthopedics;   Laterality: Left;    Family History  Problem Relation Age of Onset   Diabetes Mellitus II Mother    Cancer Mother    Cancer Father    Hypertension Father     Social History   Tobacco Use   Smoking status: Every Day    Packs/day: 0.50    Years: 13.00    Total pack years: 6.50    Types: Cigarettes   Smokeless tobacco: Never  Substance Use Topics   Alcohol use: Yes    Comment: daily beers   Drug use: No    Comment: Quit 06/2004    Medications: I have reviewed the patient's current medications. Allergies as of 02/22/2022   No Known Allergies      Medication List        Accurate as of February 22, 2022 11:59 PM. If you have any questions, ask your nurse or doctor.          STOP taking these medications    B-6 100 MG Tabs Stopped by: Mariette Cowley A Tenecia Ignasiak, DO       TAKE these medications    levETIRAcetam 500 MG 24 hr tablet Commonly known as: Keppra XR Take 3 tablets (1,500 mg total) by mouth daily.         ROS:  Constitutional: negative for chills, fatigue, and fevers Eyes: negative for visual disturbance and pain Ears, nose, mouth, throat, and face: negative for ear drainage, sore throat, and sinus problems Respiratory:  negative for cough, wheezing, and shortness of breath Cardiovascular: negative for chest pain Gastrointestinal: negative for abdominal pain, nausea, reflux symptoms, and vomiting Genitourinary:negative for dysuria, frequency, and urinary retention Integument/breast: negative for dryness and rash Hematologic/lymphatic: negative for bleeding and lymphadenopathy Musculoskeletal:negative for back pain, neck pain, and joint pain Behavioral/Psych: negative  Blood pressure 127/89, pulse 62, temperature 98.7 F (37.1 C), temperature source Oral, resp. rate 12, height 5\' 8"  (1.727 m), weight 182 lb (82.6 kg), SpO2 99 %. Physical Exam Vitals reviewed.  Constitutional:      Appearance: Normal appearance.  HENT:     Head: Normocephalic  and atraumatic.  Eyes:     Extraocular Movements: Extraocular movements intact.     Pupils: Pupils are equal, round, and reactive to light.  Cardiovascular:     Rate and Rhythm: Normal rate and regular rhythm.  Pulmonary:     Effort: Pulmonary effort is normal.     Breath sounds: Normal breath sounds.  Abdominal:     Comments: Abdomen soft, nondistended, no percussion tenderness, nontender to palpation; no rigidity, guarding, or rebound tenderness; soft and reducible left inguinal hernia  Musculoskeletal:        General: Normal range of motion.     Cervical back: Normal range of motion.  Skin:    General: Skin is warm and dry.  Neurological:     General: No focal deficit present.     Mental Status: He is alert and oriented to person, place, and time.  Psychiatric:        Mood and Affect: Mood normal.        Behavior: Behavior normal.     Results: No results found for this or any previous visit (from the past 48 hour(s)).  No results found.   Assessment & Plan:  Dalton [name] is a 46 y.o. male who presents for evaluation of left inguinal hernia.  -I explained the pathophysiology of hernias, and why we recommend surgical repair -The risk and benefits of open left inguinal hernia repair with mesh were discussed including but not limited to bleeding, infection, injury to surrounding structures, hernia recurrence, and need for additional procedure.  After careful consideration, JOFFREY RAJAN has decided to proceed with surgical intervention.  -Information provided to the patient regarding getting financial assistance for coverage of the cost of surgery -Patient tentatively scheduled for surgery on October 18 -Advised the patient to present to the ED if he begins to have fever, chills, worsening groin pain, nonreducible bulge, nausea, vomiting, and obstipation  All questions were answered to the satisfaction of the patient and family.  Graciella Freer, DO [facility] [address] [place], [place] [address] [phone] (office)

## 2022-03-21 ENCOUNTER — Encounter (HOSPITAL_COMMUNITY): Admission: RE | Admit: 2022-03-21 | Payer: Self-pay | Source: Ambulatory Visit

## 2022-03-23 ENCOUNTER — Encounter (HOSPITAL_COMMUNITY): Admission: RE | Payer: Self-pay | Source: Home / Self Care

## 2022-03-23 ENCOUNTER — Ambulatory Visit (HOSPITAL_COMMUNITY): Admission: RE | Admit: 2022-03-23 | Payer: Self-pay | Source: Home / Self Care | Admitting: Surgery

## 2022-03-23 SURGERY — REPAIR, HERNIA, INGUINAL, ADULT
Anesthesia: General | Laterality: Left

## 2023-02-07 ENCOUNTER — Telehealth: Payer: Self-pay

## 2023-02-07 NOTE — Telephone Encounter (Signed)
Attempted call for follow up/wellness call to [facility]. No answer, left message requesting return call.    Francee Nodal RN [employer]

## 2023-03-20 ENCOUNTER — Other Ambulatory Visit: Payer: Self-pay

## 2023-03-20 ENCOUNTER — Emergency Department (HOSPITAL_COMMUNITY)
Admission: EM | Admit: 2023-03-20 | Discharge: 2023-03-20 | Disposition: A | Discharge status: Home / Self Care | Payer: Self-pay | Attending: Emergency Medicine | Admitting: Emergency Medicine

## 2023-03-20 ENCOUNTER — Emergency Department (HOSPITAL_COMMUNITY): Payer: Self-pay

## 2023-03-20 DIAGNOSIS — E860 Dehydration: Secondary | ICD-10-CM | POA: Insufficient documentation

## 2023-03-20 DIAGNOSIS — R569 Unspecified convulsions: Secondary | ICD-10-CM | POA: Insufficient documentation

## 2023-03-20 DIAGNOSIS — F17210 Nicotine dependence, cigarettes, uncomplicated: Secondary | ICD-10-CM | POA: Insufficient documentation

## 2023-03-20 DIAGNOSIS — Y900 Blood alcohol level of less than 20 mg/100 ml: Secondary | ICD-10-CM | POA: Insufficient documentation

## 2023-03-20 DIAGNOSIS — Z91148 Patient's other noncompliance with medication regimen for other reason: Secondary | ICD-10-CM | POA: Insufficient documentation

## 2023-03-20 DIAGNOSIS — F1010 Alcohol abuse, uncomplicated: Secondary | ICD-10-CM | POA: Insufficient documentation

## 2023-03-20 LAB — COMPREHENSIVE METABOLIC PANEL
ALT: 20 U/L (ref 0–44)
AST: 27 U/L (ref 15–41)
Albumin: 4.3 g/dL (ref 3.5–5.0)
Alkaline Phosphatase: 82 U/L (ref 38–126)
Anion gap: 13 (ref 5–15)
BUN: 13 mg/dL (ref 6–20)
CO2: 19 mmol/L — ABNORMAL LOW (ref 22–32)
Calcium: 8.6 mg/dL — ABNORMAL LOW (ref 8.9–10.3)
Chloride: 109 mmol/L (ref 98–111)
Creatinine, Ser: 1.68 mg/dL — ABNORMAL HIGH (ref 0.61–1.24)
GFR, Estimated: 50 mL/min — ABNORMAL LOW (ref >60)
Glucose, Bld: 78 mg/dL (ref 70–99)
Potassium: 4.4 mmol/L (ref 3.5–5.1)
Sodium: 141 mmol/L (ref 135–145)
Total Bilirubin: 0.5 mg/dL (ref 0.3–1.2)
Total Protein: 7.8 g/dL (ref 6.5–8.1)

## 2023-03-20 LAB — CBC WITH DIFFERENTIAL/PLATELET
Abs Immature Granulocytes: 0 10*3/uL (ref 0.00–0.07)
Basophils Absolute: 0.3 10*3/uL — ABNORMAL HIGH (ref 0.0–0.1)
Basophils Relative: 1 %
Eosinophils Absolute: 0.8 10*3/uL — ABNORMAL HIGH (ref 0.0–0.5)
Eosinophils Relative: 3 %
HCT: 40.2 % (ref 39.0–52.0)
Hemoglobin: 13.4 g/dL (ref 13.0–17.0)
Lymphocytes Relative: 4 %
Lymphs Abs: 1.1 10*3/uL (ref 0.7–4.0)
MCH: 30.9 pg (ref 26.0–34.0)
MCHC: 33.3 g/dL (ref 30.0–36.0)
MCV: 92.8 fL (ref 80.0–100.0)
Monocytes Absolute: 2.2 10*3/uL — ABNORMAL HIGH (ref 0.1–1.0)
Monocytes Relative: 8 %
Neutro Abs: 22.8 10*3/uL — ABNORMAL HIGH (ref 1.7–7.7)
Neutrophils Relative %: 84 %
Platelets: 243 10*3/uL (ref 150–400)
RBC: 4.33 MIL/uL (ref 4.22–5.81)
RDW: 14.5 % (ref 11.5–15.5)
WBC: 27.1 10*3/uL — ABNORMAL HIGH (ref 4.0–10.5)
nRBC: 0 % (ref 0.0–0.2)

## 2023-03-20 LAB — MAGNESIUM: Magnesium: 2.3 mg/dL (ref 1.7–2.4)

## 2023-03-20 LAB — ETHANOL: Alcohol, Ethyl (B): <10 mg/dL (ref <10)

## 2023-03-20 LAB — CBG MONITORING, ED: Glucose-Capillary: 108 mg/dL — ABNORMAL HIGH (ref 70–99)

## 2023-03-20 MED ORDER — LORAZEPAM 1 MG PO TABS
1.0000 mg | ORAL_TABLET | Freq: Once | ORAL | Status: AC
  Administered 2023-03-20: 1 mg via ORAL
  Filled 2023-03-20: qty 1

## 2023-03-20 MED ORDER — LEVETIRACETAM IN NACL 1500 MG/100ML IV SOLN
1500.0000 mg | Freq: Once | INTRAVENOUS | Status: AC
  Administered 2023-03-20: 1500 mg via INTRAVENOUS
  Filled 2023-03-20: qty 100

## 2023-03-20 MED ORDER — LORAZEPAM 2 MG/ML IJ SOLN: 1.0000 mg | Freq: Once | INTRAMUSCULAR | Status: DC

## 2023-03-20 MED ORDER — SODIUM CHLORIDE 0.9 % IV BOLUS
1000.0000 mL | Freq: Once | INTRAVENOUS | Status: AC
  Administered 2023-03-20: 1000 mL via INTRAVENOUS

## 2023-03-20 NOTE — ED Notes (Signed)
Pt in room drinking fluids at this time and says he will give urine shortly

## 2023-03-20 NOTE — ED Provider Triage Note (Signed)
Emergency Medicine Provider Triage Evaluation Note  Dalton [name] , a 47 y.o. male  was evaluated in triage.  Pt complains of seizures.  Review of Systems  Positive: Seizures Negative: (Unable to provide history)  Physical Exam  BP 137/88 (BP Location: Left Arm)   Pulse 95   Temp 98.7 F (37.1 C) (Oral)   Resp (!) 23   SpO2 95%  Gen:   Somnolent Resp:  Normal effort  MSK:   Moves all extremities   Medical Decision Making  Medically screening exam initiated at 7:08 AM.  Appropriate orders placed.  Dalton [name] was informed that the remainder of the evaluation will be completed by another provider, this initial triage assessment does not replace that evaluation, and the importance of remaining in the ED until their evaluation is complete.  Patient presenting by EMS for report of seizures.  These were reportedly witnessed by his significant other who reported several seizures overnight.  No medications were given by EMS prior to arrival.  Patient arrives somnolent.  He is maintaining his airway and moving all extremities.  Workup was initiated.  1 mg of Ativan and 1500 mg Keppra ordered.   Dalton Manchester, MD 03/20/23 [phone]

## 2023-03-20 NOTE — Discharge Instructions (Addendum)
It was a pleasure caring for you today in the emergency department.  Please take your keppra as prescribed  Please stop alcohol use  Please drink plenty of fluids over the next few days and see your PCP in one week for repeat labs  Please return to the emergency department for any worsening or worrisome symptoms.            Per [facility] statutes, patients with seizures are not allowed to drive until they have been seizure-free for six months.  Other recommendations include using caution when using heavy equipment or power tools. Avoid working on ladders or at heights. Take showers instead of baths.  Do not swim alone.  Ensure the water temperature is not too high on the home water heater. Do not go swimming alone. Do not lock yourself in a room alone (i.e. bathroom). When caring for infants or small children, sit down when holding, feeding, or changing them to minimize risk of injury to the child in the event you have a seizure. Maintain good sleep hygiene. Avoid alcohol.  Also recommend adequate sleep, hydration, good diet and minimize stress.     During the Seizure   - First, ensure adequate ventilation and place patients on the floor on their left side  Loosen clothing around the neck and ensure the airway is patent. If the patient is clenching the teeth, do not force the mouth open with any object as this can cause severe damage - Remove all items from the surrounding that can be hazardous. The patient may be oblivious to what's happening and may not even know what he or she is doing. If the patient is confused and wandering, either gently guide him/her away and block access to outside areas - Reassure the individual and be comforting - Call 911. In most cases, the seizure ends before EMS arrives. However, there are cases when seizures may last over 3 to 5 minutes. Or the individual may have developed breathing difficulties or severe injuries. If a pregnant patient or a  person with diabetes develops a seizure, it is prudent to call an ambulance. - Finally, if the patient does not regain full consciousness, then call EMS. Most patients will remain confused for about 45 to 90 minutes after a seizure, so you must use judgment in calling for help. - Avoid restraints but make sure the patient is in a bed with padded side rails - Place the individual in a lateral position with the neck slightly flexed; this will help the saliva drain from the mouth and prevent the tongue from falling backward - Remove all nearby furniture and other hazards from the area - Provide verbal assurance as the individual is regaining consciousness - Provide the patient with privacy if possible - Call for help and start treatment as ordered by the caregiver   After the Seizure (Postictal Stage)   After a seizure, most patients experience confusion, fatigue, muscle pain and/or a headache. Thus, one should permit the individual to sleep. For the next few days, reassurance is essential. Being calm and helping reorient the person is also of importance.   Most seizures are painless and end spontaneously. Seizures are not harmful to others but can lead to complications such as stress on the lungs, brain and the heart. Individuals with prior lung problems may develop labored breathing and respiratory distress.
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
## 2023-03-20 NOTE — ED Triage Notes (Signed)
Patient from home for seizures. Patient has history of seizures, but per family, he is not compliant with medications. EMS reports patient was postictal with them. Upon arrival to ER, patient is alert and oriented x2; reports headache

## 2023-03-21 LAB — LEVETIRACETAM LEVEL: Levetiracetam Lvl: <2 ug/mL — ABNORMAL LOW (ref 10.0–40.0)

## 2023-03-28 ENCOUNTER — Telehealth: Payer: Self-pay

## 2023-03-28 NOTE — Telephone Encounter (Addendum)
Made first attempt to f/u with pt by phone but pt was unavailable at the time of the call.     This call was in attempt to provide medical case management to ensure pt did not have any additional questions or concerns regarding any post hospital instruction,medical concerns, obtaining her medications and serving as a reminder of any upcoming appointments.    Will attempt to make additional phone contact, if pt does attempt to return call first

## 2023-07-11 ENCOUNTER — Telehealth: Payer: Self-pay

## 2023-07-11 NOTE — Telephone Encounter (Signed)
 Attempted wellness call to [facility] client. NO answer, unable to leave voicemail as it is not set up.  Last seen at [facility] was 08/11/22 and no future appointments scheduled at this time. Client missed last appointment that was scheduled for 08/26/22 for a nurse visit.   Avelina JONELLE Skeen RN [employer]

## 2024-01-20 ENCOUNTER — Emergency Department (HOSPITAL_BASED_OUTPATIENT_CLINIC_OR_DEPARTMENT_OTHER)
Admission: EM | Admit: 2024-01-20 | Discharge: 2024-01-20 | Disposition: A | Discharge status: Home / Self Care | Payer: Self-pay

## 2024-01-20 ENCOUNTER — Emergency Department (HOSPITAL_BASED_OUTPATIENT_CLINIC_OR_DEPARTMENT_OTHER): Payer: Self-pay | Admitting: Radiology

## 2024-01-20 ENCOUNTER — Other Ambulatory Visit: Payer: Self-pay

## 2024-01-20 ENCOUNTER — Emergency Department (HOSPITAL_BASED_OUTPATIENT_CLINIC_OR_DEPARTMENT_OTHER): Payer: Self-pay

## 2024-01-20 ENCOUNTER — Encounter (HOSPITAL_BASED_OUTPATIENT_CLINIC_OR_DEPARTMENT_OTHER): Payer: Self-pay | Admitting: Urology

## 2024-01-20 DIAGNOSIS — L089 Local infection of the skin and subcutaneous tissue, unspecified: Secondary | ICD-10-CM

## 2024-01-20 DIAGNOSIS — L02512 Cutaneous abscess of left hand: Secondary | ICD-10-CM | POA: Insufficient documentation

## 2024-01-20 MED ORDER — SULFAMETHOXAZOLE-TRIMETHOPRIM 800-160 MG PO TABS
1.0000 | ORAL_TABLET | Freq: Two times a day (BID) | ORAL | 0 refills | Status: AC
Start: 2024-01-20 — End: 2024-01-30

## 2024-01-20 MED ORDER — LIDOCAINE HCL (PF) 1 % IJ SOLN
10.0000 mL | Freq: Once | INTRAMUSCULAR | Status: AC
  Administered 2024-01-20: 10 mL
  Filled 2024-01-20: qty 10

## 2024-01-20 MED ORDER — CEPHALEXIN 500 MG PO CAPS: 500.0000 mg | ORAL_CAPSULE | Freq: Two times a day (BID) | ORAL | 0 refills | Status: AC

## 2024-01-20 NOTE — Discharge Instructions (Addendum)
 Please follow up with Dr. Delene this week. Give his office a call on Monday to establish care.    Keep the area covered especially at work.   Please soak the wound as we discussed. You can buy antibacterial soap and place it in Tupperware with warm water and soak the wound for 15 minutes at a time for three times daily.    If you notice any increasing swelling/pain then come back to the ED.   For pain, you can take 1000 mg of Tylenol  or 1 g of Tylenol  every 6-8 hours.  Do not exceed more than 4000 mg or 4 g in a 24-hour period.  You can also take ibuprofen 600 to 800 mg every 6-8 hours as well.  Do not take this high-dose ibuprofen for greater than a week.

## 2024-01-20 NOTE — ED Triage Notes (Signed)
 Pt states got stick stuck in left pointer finger approx 3 months ago  Swelling noted with redness  Draining purulent drainage

## 2024-01-20 NOTE — ED Provider Notes (Signed)
 [facility] [facility] AT [facility] Provider Note   CSN: [id number] Arrival date & time: 01/20/24  1422     Patient presents with: Foreign Body in Skin   Dalton [name] is a 48 y.o. male.   HPI    Presents because of concern for foreign body at the base of his pointer finger.  Patient states he thinks he had a piece of wood in this area for the past couple months.  Over the past month, it is been enlarged and has been draining pus.  Patient states he has full range of the finger.  Last tetanus shot was within the last 10 years.  No fever no chills.  No swelling that extends beyond the base of the pointer finger that he can endorse.  Having more pain here recently.  Patient states he feels like he got some of the wood out but not all of it.   Prior to Admission medications   Medication Sig Start Date End Date Taking? Authorizing Provider  cephALEXin  (KEFLEX ) 500 MG capsule Take 1 capsule (500 mg total) by mouth 2 (two) times daily. 01/20/24  Yes Simon Lavonia SAILOR, MD  sulfamethoxazole -trimethoprim  (BACTRIM  DS) 800-160 MG tablet Take 1 tablet by mouth 2 (two) times daily for 10 days. 01/20/24 01/30/24 Yes Simon Lavonia SAILOR, MD  levETIRAcetam  (KEPPRA  XR) 500 MG 24 hr tablet Take 3 tablets (1,500 mg total) by mouth daily. Patient taking differently: Take 500 mg by mouth 3 (three) times daily. 10/25/21   Danford, Lonni SQUIBB, MD    Allergies: Patient has no known allergies.    Review of Systems  Constitutional:  Negative for chills and fever.  HENT:  Negative for ear pain and sore throat.   Eyes:  Negative for pain and visual disturbance.  Respiratory:  Negative for cough and shortness of breath.   Cardiovascular:  Negative for chest pain and palpitations.  Gastrointestinal:  Negative for abdominal pain and vomiting.  Genitourinary:  Negative for dysuria and hematuria.  Musculoskeletal:  Negative for arthralgias and back pain.  Skin:  Negative for color change and rash.   Neurological:  Negative for seizures and syncope.  All other systems reviewed and are negative.   Updated Vital Signs BP (!) 141/92   Pulse 61   Temp 98.2 F (36.8 C)   Resp 18   Ht 5' 8 (1.727 m)   Wt 82.6 kg   SpO2 100%   BMI 27.69 kg/m   Physical Exam Vitals and nursing note reviewed.  Constitutional:      General: He is not in acute distress.    Appearance: He is well-developed.  HENT:     Head: Normocephalic and atraumatic.  Eyes:     Conjunctiva/sclera: Conjunctivae normal.  Cardiovascular:     Rate and Rhythm: Normal rate and regular rhythm.     Heart sounds: No murmur heard. Pulmonary:     Effort: Pulmonary effort is normal. No respiratory distress.     Breath sounds: Normal breath sounds.  Abdominal:     Palpations: Abdomen is soft.     Tenderness: There is no abdominal tenderness.  Musculoskeletal:        General: No swelling.       Hands:     Cervical back: Neck supple.  Skin:    General: Skin is warm and dry.     Capillary Refill: Capillary refill takes less than 2 seconds.  Neurological:     Mental Status: He is alert.  Psychiatric:        Mood and Affect: Mood normal.       Media Information  Document Information  Photos    01/20/2024 16:15  Attached To:  Hospital Encounter on 01/20/24  Source Information  [place] [place], [place]  [facility]      Media Information  Document Information  Photos    01/20/2024 16:15  Attached To:  Hospital Encounter on 01/20/24  Source Information  [place] [place]  [facility]     Media Information  Document Information  Photos    01/20/2024 16:15  Attached To:  Hospital Encounter on 01/20/24  Source Information  [place] [place], [place]  [facility]    (all labs ordered are listed, but only abnormal results are displayed) Labs Reviewed  AEROBIC CULTURE W GRAM STAIN (SUPERFICIAL SPECIMEN)    EKG: None  Radiology: DG Finger Index Left Result Date:  01/20/2024 CLINICAL DATA:  A piece of a stick penetrated the soft tissues of the left second digit a few months ago. Now with swelling and drainage. EXAM: LEFT INDEX FINGER 2+V COMPARISON:  05/16/2014. FINDINGS: Focal soft tissue swelling noted at the base of the index finger. No underlying fracture or bony destruction. No evidence for radiopaque soft tissue foreign body. IMPRESSION: Focal soft tissue swelling at the base of the index finger. No evidence for radiopaque soft tissue foreign body. Would can be occult on x-ray. No findings to suggest underlying osteomyelitis. Electronically Signed   By: Camellia Candle M.D.   On: 01/20/2024 15:36     .Nerve Block  Date/Time: 01/20/2024 4:58 PM  Performed by: Simon Lavonia SAILOR, MD Authorized by: Simon Lavonia SAILOR, MD   Consent:    Consent obtained:  Verbal   Consent given by:  Patient   Risks, benefits, and alternatives were discussed: yes     Risks discussed:  Allergic reaction, bleeding, intravenous injection, infection, swelling, unsuccessful block, pain and nerve damage   Alternatives discussed:  Alternative treatment Universal protocol:    Patient identity confirmed:  Verbally with patient Indications:    Indications:  Pain relief and procedural anesthesia Location:    Body area:  Upper extremity   Upper extremity nerve:  Metacarpal   Laterality:  Left Pre-procedure details:    Skin preparation:  Povidone-iodine Skin anesthesia:    Skin anesthesia method:  None Procedure details:    Block needle gauge:  27 G   Anesthetic injected:  Lidocaine  1% w/o epi   Additive injected:  None   Injection procedure:  Anatomic landmarks identified, anatomic landmarks palpated, incremental injection, introduced needle and negative aspiration for blood   Paresthesia:  Immediately resolved Post-procedure details:    Dressing:  Sterile dressing   Outcome:  Anesthesia achieved   Procedure completion:  Tolerated .Incision and Drainage  Date/Time: 01/20/2024  5:00 PM  Performed by: Simon Lavonia SAILOR, MD Authorized by: Simon Lavonia SAILOR, MD   Consent:    Consent obtained:  Verbal   Consent given by:  Patient   Risks, benefits, and alternatives were discussed: yes     Risks discussed:  Bleeding, incomplete drainage, pain, infection and damage to other organs   Alternatives discussed:  Alternative treatment Location:    Type:  Abscess   Size:  1 x 1 cm   Location:  Upper extremity   Upper extremity location:  Finger   Finger location:  L index finger Pre-procedure details:    Skin preparation:  Chlorhexidine  with alcohol and  povidone-iodine Sedation:    Sedation type:  None Anesthesia:    Anesthesia method:  Nerve block Procedure type:    Complexity:  Complex Procedure details:    Ultrasound guidance: yes     Incision types:  Single straight   Wound management:  Probed and deloculated and irrigated with saline   Drainage:  Serosanguinous   Drainage amount:  Moderate   Wound treatment:  Wound left open   Packing materials:  None Post-procedure details:    Procedure completion:  Tolerated    Medications Ordered in the ED  lidocaine  (PF) (XYLOCAINE ) 1 % injection 10 mL (10 mLs Other Given 01/20/24 1555)    Clinical Course as of 01/20/24 1812  Sat Jan 20, 2024  1634 Per Dr. Delene from hand surgery:  Mid axial line of finger. Stay along that line or dorsal. Make skin incision full thickness. Incision need to be more than half of full length of proximal phalanx.    10 days of abx.   Antibacterial soap and mix in water and clean bowl.  [TL]    Clinical Course User Index [TL] Simon Lavonia SAILOR, MD                                 Medical Decision Making Amount and/or Complexity of Data Reviewed Radiology: ordered.  Risk Prescription drug management.   Presents because of concern for foreign body at the base of his pointer finger.  Patient states he thinks he had a piece of wood in this area for the past couple months.  Over  the past month, it is been enlarged and has been draining pus.  Patient states he has full range of the finger.  Last tetanus shot was within the last 10 years.  No fever no chills.  No swelling that extends beyond the base of the pointer finger that he can endorse.  Having more pain here recently.  Patient states he feels like he got some of the wood out but not all of it.   Upon exam, patient hemodynamically stable.  ANO x 3 GCS 15.  Afebrile.  Patient has some swelling at the base of the left pointer finger.  No concerns for flexor tenosynovitis at this point in time.  Seems to be localized.   X-ray did not note any kind of foreign body.  Ultrasound did not note any kind of obvious foreign body.  Did note fluid collection consistent for probable abscess.   Spoke to Dr. Delene from hand surgery. Recs as follows:  Incision Mid axial line of finger. Stay along that line or dorsal. Make skin incision full thickness. Incision need to be more than half of full length of proximal phalanx.   10 days of abx.  Antibacterial soap and mix in water and clean bowl.    Incision as above.  Was able to drain some slight purulent material after probing with a Burnard.  Was also able to drain some of the fluid through already open calcified region along the palmar aspect of his finger with steady pressure.  He was not able to tolerate this until after completed finger block.  Do not want to open up the palmar aspect given concern for neurovascular bundle as well as flexor tendons.  Will defer to hand surgery to see if they would open this up further.   Reviewed patient's prior labs.  Do not need to dose adjust Bactrim .  Will start patient on Bactrim  and Keflex  for 10 days.  Recommended patient soak the area with warm water and antibacterial soap 15 minutes x 3 times a day.   Strict return precautions for the patient        Final diagnoses:  Finger infection    ED Discharge Orders           Ordered    cephALEXin  (KEFLEX ) 500 MG capsule  2 times daily        01/20/24 1703    sulfamethoxazole -trimethoprim  (BACTRIM  DS) 800-160 MG tablet  2 times daily        01/20/24 1703               Simon Lavonia SAILOR, MD 01/20/24 [phone]

## 2024-01-23 LAB — AEROBIC CULTURE W GRAM STAIN (SUPERFICIAL SPECIMEN)

## 2024-01-24 ENCOUNTER — Telehealth (HOSPITAL_BASED_OUTPATIENT_CLINIC_OR_DEPARTMENT_OTHER): Payer: Self-pay

## 2024-01-24 NOTE — Telephone Encounter (Signed)
 Post ED Visit - Positive Culture Follow-up  Culture report reviewed by antimicrobial stewardship pharmacist: [facility] [x]  Feliciano Close, Pharm.D. []  Venetia Gully, Pharm.D., BCPS AQ-ID []  Garrel Crews, Pharm.D., BCPS []  Almarie Lunger, Pharm.D., BCPS []  [place]ee[place], [place].D., BCPS, AAHIVP []  Rosaline Bihari, Pharm.D., BCPS, AAHIVP []  Vernell Meier, PharmD, BCPS []  Latanya Hint, PharmD, BCPS []  Donald Medley, PharmD, BCPS []  Rocky Bold, PharmD []  Dorothyann Alert, PharmD, BCPS []  Morene Babe, PharmD  [facility] []  Rosaline Edison, PharmD []  Romona Bliss, PharmD []  Dolphus Roller, PharmD []  Veva Seip, Rph []  Vernell Daunt) Leonce, PharmD []  Eva Allis, PharmD []  Rosaline Millet, PharmD []  Iantha Batch, PharmD []  Arvin Gauss, PharmD []  Wanda Hasting, PharmD []  Ronal Rav, PharmD []  Rocky Slade, PharmD []  Bard Jeans, PharmD   Positive wound culture Treated with Sulfamethoxazole , organism sensitive to the same and no further patient follow-up is required at this time.  [name] 01/24/2024, 9:57 AM
